# Patient Record
Sex: Female | Born: 1990 | Race: White | Hispanic: No | Marital: Single | Smoking: Former smoker
Health system: Southern US, Community
[De-identification: ages and names within clinical notes are randomized; demographics above are authoritative.]

## PROBLEM LIST (undated history)

## (undated) ENCOUNTER — Inpatient Hospital Stay (HOSPITAL_COMMUNITY): Payer: Self-pay

## (undated) DIAGNOSIS — R569 Unspecified convulsions: Secondary | ICD-10-CM

## (undated) DIAGNOSIS — F419 Anxiety disorder, unspecified: Secondary | ICD-10-CM

## (undated) DIAGNOSIS — M419 Scoliosis, unspecified: Secondary | ICD-10-CM

## (undated) HISTORY — PX: WISDOM TOOTH EXTRACTION: SHX21

## (undated) HISTORY — PX: FINGER SURGERY: SHX640

---
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
## 2011-02-22 LAB — URINALYSIS, ROUTINE W REFLEX MICROSCOPIC
Bilirubin Urine: NEGATIVE
Ketones, ur: NEGATIVE mg/dL
Nitrite: NEGATIVE
Specific Gravity, Urine: 1.015 (ref 1.005–1.030)
Urobilinogen, UA: 0.2 mg/dL (ref 0.0–1.0)
pH: 6.5 (ref 5.0–8.0)

## 2011-02-22 LAB — CBC
HCT: 28.7 % — ABNORMAL LOW (ref 36.0–46.0)
HCT: 37.5 % (ref 36.0–46.0)
Hemoglobin: 12.8 g/dL (ref 12.0–15.0)
MCHC: 34.1 g/dL (ref 30.0–36.0)
MCHC: 34.3 g/dL (ref 30.0–36.0)
Platelets: 196 10*3/uL (ref 150–400)
RDW: 12 % (ref 11.5–15.5)
RDW: 12.5 % (ref 11.5–15.5)
RDW: 12.5 % (ref 11.5–15.5)
WBC: 15.6 10*3/uL — ABNORMAL HIGH (ref 4.0–10.5)
WBC: 8.7 10*3/uL (ref 4.0–10.5)

## 2011-02-22 LAB — URINE MICROSCOPIC-ADD ON

## 2011-02-22 LAB — URINE CULTURE

## 2011-02-22 LAB — DIFFERENTIAL
Eosinophils Relative: 2 % (ref 0–5)
Lymphs Abs: 1.8 10*3/uL (ref 0.7–4.0)
Neutro Abs: 6.3 10*3/uL (ref 1.7–7.7)
Neutrophils Relative %: 72 % (ref 43–77)

## 2011-02-22 LAB — RPR: RPR Ser Ql: NONREACTIVE

## 2011-02-26 LAB — URINE CULTURE: Colony Count: 8000

## 2011-02-26 LAB — URINALYSIS, ROUTINE W REFLEX MICROSCOPIC
Glucose, UA: NEGATIVE mg/dL
Hgb urine dipstick: NEGATIVE
Ketones, ur: NEGATIVE mg/dL
Nitrite: NEGATIVE
Protein, ur: NEGATIVE mg/dL
Urobilinogen, UA: 0.2 mg/dL (ref 0.0–1.0)
pH: 7.5 (ref 5.0–8.0)

## 2011-02-26 LAB — URINE MICROSCOPIC-ADD ON

## 2011-03-10 ENCOUNTER — Emergency Department (HOSPITAL_COMMUNITY)
Admission: EM | Admit: 2011-03-10 | Discharge: 2011-03-10 | Disposition: A | Discharge status: Home / Self Care | Payer: Medicaid Other | Attending: Emergency Medicine | Admitting: Emergency Medicine

## 2011-03-10 DIAGNOSIS — E739 Lactose intolerance, unspecified: Secondary | ICD-10-CM | POA: Insufficient documentation

## 2011-03-10 DIAGNOSIS — R109 Unspecified abdominal pain: Secondary | ICD-10-CM | POA: Insufficient documentation

## 2011-03-10 DIAGNOSIS — J029 Acute pharyngitis, unspecified: Secondary | ICD-10-CM | POA: Insufficient documentation

## 2011-03-10 LAB — POCT PREGNANCY, URINE: Preg Test, Ur: NEGATIVE

## 2011-03-10 LAB — URINALYSIS, ROUTINE W REFLEX MICROSCOPIC
Protein, ur: NEGATIVE mg/dL
Specific Gravity, Urine: 1.019 (ref 1.005–1.030)

## 2011-03-11 LAB — URINALYSIS, ROUTINE W REFLEX MICROSCOPIC
Glucose, UA: NEGATIVE mg/dL
Ketones, ur: >80 mg/dL — AB
Protein, ur: NEGATIVE mg/dL

## 2011-03-12 LAB — COMPREHENSIVE METABOLIC PANEL
ALT: 20 U/L (ref 0–35)
Calcium: 8.8 mg/dL (ref 8.4–10.5)
Creatinine, Ser: 0.78 mg/dL (ref 0.4–1.2)
Glucose, Bld: 92 mg/dL (ref 70–99)
Sodium: 138 mEq/L (ref 135–145)
Total Protein: 7.2 g/dL (ref 6.0–8.3)

## 2011-03-12 LAB — URINALYSIS, ROUTINE W REFLEX MICROSCOPIC
Glucose, UA: NEGATIVE mg/dL
Ketones, ur: NEGATIVE mg/dL
Nitrite: NEGATIVE
Urobilinogen, UA: 1 mg/dL (ref 0.0–1.0)

## 2011-03-12 LAB — CBC
Hemoglobin: 14.3 g/dL (ref 12.0–16.0)
MCHC: 33.5 g/dL (ref 31.0–37.0)
MCV: 88.1 fL (ref 78.0–98.0)
RDW: 13 % (ref 11.4–15.5)

## 2011-03-12 LAB — DIFFERENTIAL
Lymphocytes Relative: 19 % — ABNORMAL LOW (ref 24–48)
Lymphs Abs: 2 10*3/uL (ref 1.1–4.8)
Monocytes Relative: 6 % (ref 3–11)
Neutro Abs: 7.9 10*3/uL (ref 1.7–8.0)
Neutrophils Relative %: 73 % — ABNORMAL HIGH (ref 43–71)

## 2011-03-12 LAB — GC/CHLAMYDIA PROBE AMP, GENITAL
Chlamydia, DNA Probe: NEGATIVE
GC Probe Amp, Genital: NEGATIVE

## 2011-03-12 LAB — WET PREP, GENITAL: Yeast Wet Prep HPF POC: NONE SEEN

## 2011-03-12 LAB — LIPASE, BLOOD: Lipase: 28 U/L (ref 11–59)

## 2011-03-14 LAB — GC/CHLAMYDIA PROBE AMP, GENITAL
Chlamydia, DNA Probe: NEGATIVE
GC Probe Amp, Genital: NEGATIVE

## 2011-03-14 LAB — URINALYSIS, ROUTINE W REFLEX MICROSCOPIC
Nitrite: NEGATIVE
Specific Gravity, Urine: <1.005 — ABNORMAL LOW (ref 1.005–1.030)
Urobilinogen, UA: 0.2 mg/dL (ref 0.0–1.0)
pH: 6.5 (ref 5.0–8.0)

## 2011-03-14 LAB — CBC
MCHC: 33.9 g/dL (ref 31.0–37.0)
MCV: 87.5 fL (ref 78.0–98.0)
RBC: 4.62 MIL/uL (ref 3.80–5.70)
RDW: 12.9 % (ref 11.4–15.5)

## 2011-03-14 LAB — URINE MICROSCOPIC-ADD ON

## 2011-03-14 LAB — WET PREP, GENITAL: Clue Cells Wet Prep HPF POC: NONE SEEN

## 2011-03-14 LAB — POCT PREGNANCY, URINE: Preg Test, Ur: NEGATIVE

## 2011-03-20 LAB — WET PREP, GENITAL
Clue Cells Wet Prep HPF POC: NONE SEEN
Clue Cells Wet Prep HPF POC: NONE SEEN
Trich, Wet Prep: NONE SEEN
Yeast Wet Prep HPF POC: NONE SEEN

## 2011-03-20 LAB — HCG, SERUM, QUALITATIVE: Preg, Serum: NEGATIVE

## 2011-03-20 LAB — URINALYSIS, ROUTINE W REFLEX MICROSCOPIC
Bilirubin Urine: NEGATIVE
Hgb urine dipstick: NEGATIVE
Ketones, ur: 15 mg/dL — AB
Specific Gravity, Urine: 1.02 (ref 1.005–1.030)
pH: 6 (ref 5.0–8.0)

## 2011-03-20 LAB — GC/CHLAMYDIA PROBE AMP, GENITAL
Chlamydia, DNA Probe: NEGATIVE
Chlamydia, DNA Probe: NEGATIVE

## 2011-03-21 LAB — PREGNANCY, URINE: Preg Test, Ur: NEGATIVE

## 2011-03-21 LAB — POCT PREGNANCY, URINE: Preg Test, Ur: NEGATIVE

## 2011-03-21 LAB — CBC
HCT: 41.5 % (ref 36.0–49.0)
Hemoglobin: 14.3 g/dL (ref 12.0–16.0)
MCV: 85 fL (ref 78.0–98.0)
RDW: 12.5 % (ref 11.4–15.5)

## 2011-03-21 LAB — RAPID URINE DRUG SCREEN, HOSP PERFORMED
Barbiturates: NOT DETECTED
Cocaine: NOT DETECTED
Opiates: NOT DETECTED

## 2011-03-21 LAB — DIFFERENTIAL
Basophils Absolute: 0 10*3/uL (ref 0.0–0.1)
Basophils Relative: 0 % (ref 0–1)
Monocytes Relative: 9 % (ref 3–11)
Neutro Abs: 5.9 10*3/uL (ref 1.7–8.0)
Neutrophils Relative %: 71 % (ref 43–71)

## 2011-03-21 LAB — MONONUCLEOSIS SCREEN: Mono Screen: NEGATIVE

## 2011-03-21 LAB — COMPREHENSIVE METABOLIC PANEL
Alkaline Phosphatase: 93 U/L (ref 47–119)
BUN: 8 mg/dL (ref 6–23)
Creatinine, Ser: 0.92 mg/dL (ref 0.4–1.2)
Glucose, Bld: 92 mg/dL (ref 70–99)
Potassium: 3.2 mEq/L — ABNORMAL LOW (ref 3.5–5.1)
Total Bilirubin: 0.5 mg/dL (ref 0.3–1.2)
Total Protein: 7.5 g/dL (ref 6.0–8.3)

## 2011-03-21 LAB — RAPID STREP SCREEN (MED CTR MEBANE ONLY): Streptococcus, Group A Screen (Direct): NEGATIVE

## 2011-03-21 LAB — URINALYSIS, ROUTINE W REFLEX MICROSCOPIC
Bilirubin Urine: NEGATIVE
Hgb urine dipstick: NEGATIVE
Ketones, ur: NEGATIVE mg/dL
Nitrite: NEGATIVE
Protein, ur: NEGATIVE mg/dL
Urobilinogen, UA: 0.2 mg/dL (ref 0.0–1.0)

## 2011-03-21 LAB — WET PREP, GENITAL: Clue Cells Wet Prep HPF POC: NONE SEEN

## 2011-03-21 LAB — TSH: TSH: 2.153 u[IU]/mL (ref 0.350–4.500)

## 2011-03-21 LAB — IRON AND TIBC
TIBC: 385 ug/dL (ref 250–470)
UIBC: 373 ug/dL

## 2011-03-21 LAB — HCG, QUANTITATIVE, PREGNANCY: hCG, Beta Chain, Quant, S: <2 m[IU]/mL (ref <5)

## 2011-03-21 LAB — URINE CULTURE

## 2011-03-21 LAB — GC/CHLAMYDIA PROBE AMP, GENITAL: Chlamydia, DNA Probe: NEGATIVE

## 2011-03-21 LAB — T3, FREE: T3, Free: 2.9 pg/mL (ref 2.3–4.2)

## 2011-12-08 ENCOUNTER — Encounter (HOSPITAL_COMMUNITY): Payer: Self-pay | Admitting: *Deleted

## 2011-12-08 ENCOUNTER — Inpatient Hospital Stay (HOSPITAL_COMMUNITY)
Admission: AD | Admit: 2011-12-08 | Discharge: 2011-12-08 | Disposition: A | Discharge status: Home / Self Care | Payer: Self-pay | Source: Ambulatory Visit | Attending: Obstetrics & Gynecology | Admitting: Obstetrics & Gynecology

## 2011-12-08 DIAGNOSIS — N938 Other specified abnormal uterine and vaginal bleeding: Secondary | ICD-10-CM | POA: Insufficient documentation

## 2011-12-08 DIAGNOSIS — N949 Unspecified condition associated with female genital organs and menstrual cycle: Secondary | ICD-10-CM | POA: Insufficient documentation

## 2011-12-08 HISTORY — DX: Anxiety disorder, unspecified: F41.9

## 2011-12-08 HISTORY — DX: Scoliosis, unspecified: M41.9

## 2011-12-08 LAB — WET PREP, GENITAL
Clue Cells Wet Prep HPF POC: NONE SEEN
Trich, Wet Prep: NONE SEEN
Yeast Wet Prep HPF POC: NONE SEEN

## 2011-12-08 LAB — CBC
HCT: 36 % (ref 36.0–46.0)
MCHC: 33.9 g/dL (ref 30.0–36.0)
Platelets: 179 10*3/uL (ref 150–400)
RDW: 12.3 % (ref 11.5–15.5)
WBC: 7.4 10*3/uL (ref 4.0–10.5)

## 2011-12-08 MED ORDER — ESTROGENS CONJUGATED 1.25 MG PO TABS: 1.2500 mg | ORAL_TABLET | Freq: Every day | ORAL | Status: DC

## 2011-12-08 NOTE — Progress Notes (Signed)
Pt states she has had constant vaginal bleeding for the last 2 months

## 2011-12-08 NOTE — ED Provider Notes (Signed)
History     Chief Complaint  Patient presents with  . Vaginal Bleeding   HPI  Pt states she has had constant vaginal bleeding for the last 2 months.  Also reports lower pelvic cramping that started one week after bleeding began.   Bleeding is described as more than a period.  +clots, pea sized.  Implanon placed Oct 2011.  Denies abnormal vaginal discharge or pain.     Past Medical History  Diagnosis Date  . Migraine   . Anxiety   . Scoliosis     Past Surgical History  Procedure Date  . No past surgeries   . Finger surgery     20 yo    Family History  Problem Relation Age of Onset  . Hypertension Maternal Grandmother   . Drug abuse Maternal Grandfather   . Hypertension Paternal Grandmother   . Drug abuse Paternal Grandfather     History  Substance Use Topics  . Smoking status: Current Everyday Smoker -- 0.5 packs/day  . Smokeless tobacco: Not on file  . Alcohol Use: No    Allergies:  Allergies  Allergen Reactions  . Ciprofloxacin Hcl Hives    Prescriptions prior to admission  Medication Sig Dispense Refill  . Ibuprofen-Diphenhydramine HCl (ADVIL PM) 200-25 MG CAPS Take 2 capsules by mouth at bedtime as needed. sleep         Review of Systems  Gastrointestinal: Positive for abdominal pain (sharp pain).  Genitourinary:       Vaginal bleeding   Physical Exam   Blood pressure 114/66, pulse 80, temperature 98.9 F (37.2 C), temperature source Oral, resp. rate 20, height 5\' 5"  (1.651 m), weight 71.215 kg (157 lb), last menstrual period 12/08/2011, SpO2 96.00%.  Physical Exam  Constitutional: She is oriented to person, place, and time. She appears well-developed and well-nourished.  HENT:  Head: Normocephalic.  Neck: Normal range of motion. Neck supple.  Cardiovascular: Normal rate, regular rhythm and normal heart sounds.   Respiratory: Effort normal and breath sounds normal.  GI: Soft. She exhibits no mass. There is tenderness. There is no guarding.    Genitourinary: Uterus is not enlarged. Cervix exhibits no motion tenderness. Right adnexum displays no mass and no tenderness. Left adnexum displays no mass and no tenderness. There is bleeding (negative clots) around the vagina.  Neurological: She is alert and oriented to person, place, and time. She has normal reflexes.  Skin: Skin is warm and dry.    MAU Course  Procedures  Results for orders placed during the hospital encounter of 12/08/11 (from the past 24 hour(s))  WET PREP, GENITAL     Status: Normal   Collection Time   12/08/11  9:41 PM      Component Value Range   Yeast, Wet Prep NONE SEEN  NONE SEEN    Trich, Wet Prep NONE SEEN  NONE SEEN    Clue Cells, Wet Prep NONE SEEN  NONE SEEN    WBC, Wet Prep HPF POC NONE SEEN  NONE SEEN   CBC     Status: Normal   Collection Time   12/08/11  9:42 PM      Component Value Range   WBC 7.4  4.0 - 10.5 (K/uL)   RBC 4.16  3.87 - 5.11 (MIL/uL)   Hemoglobin 12.2  12.0 - 15.0 (g/dL)   HCT 16.1  09.6 - 04.5 (%)   MCV 86.5  78.0 - 100.0 (fL)   MCH 29.3  26.0 - 34.0 (pg)  MCHC 33.9  30.0 - 36.0 (g/dL)   RDW 16.1  09.6 - 04.5 (%)   Platelets 179  150 - 400 (K/uL)  POCT PREGNANCY, URINE     Status: Normal   Collection Time   12/08/11  9:53 PM      Component Value Range   Preg Test, Ur NEGATIVE       Assessment and Plan  Dysfunctional Uterine Bleeding on Contraception  Plan: Conjugated estrogen 1.25 mg q day x 7 days FU with provider if bleeding continues  [facility] 12/08/2011, 9:37 PM

## 2011-12-09 LAB — GC/CHLAMYDIA PROBE AMP, GENITAL: Chlamydia, DNA Probe: NEGATIVE

## 2012-03-31 ENCOUNTER — Emergency Department (HOSPITAL_BASED_OUTPATIENT_CLINIC_OR_DEPARTMENT_OTHER)
Admission: EM | Admit: 2012-03-31 | Discharge: 2012-03-31 | Disposition: A | Discharge status: Home Health | Payer: Self-pay | Attending: Emergency Medicine | Admitting: Emergency Medicine

## 2012-03-31 ENCOUNTER — Encounter (HOSPITAL_BASED_OUTPATIENT_CLINIC_OR_DEPARTMENT_OTHER): Payer: Self-pay | Admitting: *Deleted

## 2012-03-31 DIAGNOSIS — F3289 Other specified depressive episodes: Secondary | ICD-10-CM | POA: Insufficient documentation

## 2012-03-31 DIAGNOSIS — J069 Acute upper respiratory infection, unspecified: Secondary | ICD-10-CM | POA: Insufficient documentation

## 2012-03-31 DIAGNOSIS — M4120 Other idiopathic scoliosis, site unspecified: Secondary | ICD-10-CM | POA: Insufficient documentation

## 2012-03-31 DIAGNOSIS — F329 Major depressive disorder, single episode, unspecified: Secondary | ICD-10-CM | POA: Insufficient documentation

## 2012-03-31 DIAGNOSIS — F411 Generalized anxiety disorder: Secondary | ICD-10-CM | POA: Insufficient documentation

## 2012-03-31 DIAGNOSIS — F17200 Nicotine dependence, unspecified, uncomplicated: Secondary | ICD-10-CM | POA: Insufficient documentation

## 2012-03-31 NOTE — Discharge Instructions (Signed)
Antibiotic Nonuse  Your caregiver felt that the infection or problem was not one that would be helped with an antibiotic. Infections may be caused by viruses or bacteria. Only a caregiver can tell which one of these is the likely cause of an illness. A cold is the most common cause of infection in both adults and children. A cold is a virus. Antibiotic treatment will have no effect on a viral infection. Viruses can lead to many lost days of work caring for sick children and many missed days of school. Children may catch as many as 10 "colds" or "flus" per year during which they can be tearful, cranky, and uncomfortable. The goal of treating a virus is aimed at keeping the ill person comfortable. Antibiotics are medications used to help the body fight bacterial infections. There are relatively few types of bacteria that cause infections but there are hundreds of viruses. While both viruses and bacteria cause infection they are very different types of germs. A viral infection will typically go away by itself within 7 to 10 days. Bacterial infections may spread or get worse without antibiotic treatment. Examples of bacterial infections are:  Sore throats (like strep throat or tonsillitis).   Infection in the lung (pneumonia).   Ear and skin infections.  Examples of viral infections are:  Colds or flus.   Most coughs and bronchitis.   Sore throats not caused by Strep.   Runny noses.  It is often best not to take an antibiotic when a viral infection is the cause of the problem. Antibiotics can kill off the helpful bacteria that we have inside our body and allow harmful bacteria to start growing. Antibiotics can cause side effects such as allergies, nausea, and diarrhea without helping to improve the symptoms of the viral infection. Additionally, repeated uses of antibiotics can cause bacteria inside of our body to become resistant. That resistance can be passed onto harmful bacterial. The next time  you have an infection it may be harder to treat if antibiotics are used when they are not needed. Not treating with antibiotics allows our own immune system to develop and take care of infections more efficiently. Also, antibiotics will work better for us when they are prescribed for bacterial infections. Treatments for a child that is ill may include:  Give extra fluids throughout the day to stay hydrated.   Get plenty of rest.   Only give your child over-the-counter or prescription medicines for pain, discomfort, or fever as directed by your caregiver.   The use of a cool mist humidifier may help stuffy noses.   Cold medications if suggested by your caregiver.  Your caregiver may decide to start you on an antibiotic if:  The problem you were seen for today continues for a longer length of time than expected.   You develop a secondary bacterial infection.  SEEK MEDICAL CARE IF:  Fever lasts longer than 5 days.   Symptoms continue to get worse after 5 to 7 days or become severe.   Difficulty in breathing develops.   Signs of dehydration develop (poor drinking, rare urinating, dark colored urine).   Changes in behavior or worsening tiredness (listlessness or lethargy).  Document Released: 02/03/2002 Document Revised: 11/14/2011 Document Reviewed: 08/02/2009 ExitCare Patient Information 2012 [employer].Upper Respiratory Infection, Adult An upper respiratory infection (URI) is also sometimes known as the common cold. The upper respiratory tract includes the nose, sinuses, throat, trachea, and bronchi. Bronchi are the airways leading to the lungs. Most   people improve within 1 week, but symptoms can last up to 2 weeks. A residual cough may last even longer.  CAUSES Many different viruses can infect the tissues lining the upper respiratory tract. The tissues become irritated and inflamed and often become very moist. Mucus production is also common. A cold is contagious. You can easily  spread the virus to others by oral contact. This includes kissing, sharing a glass, coughing, or sneezing. Touching your mouth or nose and then touching a surface, which is then touched by another person, can also spread the virus. SYMPTOMS  Symptoms typically develop 1 to 3 days after you come in contact with a cold virus. Symptoms vary from person to person. They may include:  Runny nose.   Sneezing.   Nasal congestion.   Sinus irritation.   Sore throat.   Loss of voice (laryngitis).   Cough.   Fatigue.   Muscle aches.   Loss of appetite.   Headache.   Low-grade fever.  DIAGNOSIS  You might diagnose your own cold based on familiar symptoms, since most people get a cold 2 to 3 times a year. Your caregiver can confirm this based on your exam. Most importantly, your caregiver can check that your symptoms are not due to another disease such as strep throat, sinusitis, pneumonia, asthma, or epiglottitis. Blood tests, throat tests, and X-rays are not necessary to diagnose a common cold, but they may sometimes be helpful in excluding other more serious diseases. Your caregiver will decide if any further tests are required. RISKS AND COMPLICATIONS  You may be at risk for a more severe case of the common cold if you smoke cigarettes, have chronic heart disease (such as heart failure) or lung disease (such as asthma), or if you have a weakened immune system. The very young and very old are also at risk for more serious infections. Bacterial sinusitis, middle ear infections, and bacterial pneumonia can complicate the common cold. The common cold can worsen asthma and chronic obstructive pulmonary disease (COPD). Sometimes, these complications can require emergency medical care and may be life-threatening. PREVENTION  The best way to protect against getting a cold is to practice good hygiene. Avoid oral or hand contact with people with cold symptoms. Wash your hands often if contact occurs.  There is no clear evidence that vitamin C, vitamin E, echinacea, or exercise reduces the chance of developing a cold. However, it is always recommended to get plenty of rest and practice good nutrition. TREATMENT  Treatment is directed at relieving symptoms. There is no cure. Antibiotics are not effective, because the infection is caused by a virus, not by bacteria. Treatment may include:  Increased fluid intake. Sports drinks offer valuable electrolytes, sugars, and fluids.   Breathing heated mist or steam (vaporizer or shower).   Eating chicken soup or other clear broths, and maintaining good nutrition.   Getting plenty of rest.   Using gargles or lozenges for comfort.   Controlling fevers with ibuprofen or acetaminophen as directed by your caregiver.   Increasing usage of your inhaler if you have asthma.  Zinc gel and zinc lozenges, taken in the first 24 hours of the common cold, can shorten the duration and lessen the severity of symptoms. Pain medicines may help with fever, muscle aches, and throat pain. A variety of non-prescription medicines are available to treat congestion and runny nose. Your caregiver can make recommendations and may suggest nasal or lung inhalers for other symptoms.  HOME CARE INSTRUCTIONS     Only take over-the-counter or prescription medicines for pain, discomfort, or fever as directed by your caregiver.   Use a warm mist humidifier or inhale steam from a shower to increase air moisture. This may keep secretions moist and make it easier to breathe.   Drink enough water and fluids to keep your urine clear or pale yellow.   Rest as needed.   Return to work when your temperature has returned to normal or as your caregiver advises. You may need to stay home longer to avoid infecting others. You can also use a face mask and careful hand washing to prevent spread of the virus.  SEEK MEDICAL CARE IF:   After the first few days, you feel you are getting worse  rather than better.   You need your caregiver's advice about medicines to control symptoms.   You develop chills, worsening shortness of breath, or brown or red sputum. These may be signs of pneumonia.   You develop yellow or brown nasal discharge or pain in the face, especially when you bend forward. These may be signs of sinusitis.   You develop a fever, swollen neck glands, pain with swallowing, or white areas in the back of your throat. These may be signs of strep throat.  SEEK IMMEDIATE MEDICAL CARE IF:   You have a fever.   You develop severe or persistent headache, ear pain, sinus pain, or chest pain.   You develop wheezing, a prolonged cough, cough up blood, or have a change in your usual mucus (if you have chronic lung disease).   You develop sore muscles or a stiff neck.  Document Released: 05/21/2001 Document Revised: 11/14/2011 Document Reviewed: 03/29/2011 ExitCare Patient Information 2012 [employer]. 

## 2012-03-31 NOTE — ED Notes (Signed)
Pt c/o URI symptoms x 2 days 

## 2012-03-31 NOTE — ED Provider Notes (Signed)
History     CSN: [id number]  Arrival date & time 03/31/12  1557   First MD Initiated Contact with Patient 03/31/12 1603      Chief Complaint  Patient presents with  . URI    (Consider location/radiation/quality/duration/timing/severity/associated sxs/prior treatment) HPI Comments: Pt states that she is under a lot of stress with work and she is very stressed and she cries a lot Pt denies si/hi  Patient is a 21 y.o. female presenting with URI. The history is provided by the patient. No language interpreter was used.  URI The primary symptoms include ear pain, sore throat and cough. Primary symptoms do not include fever. The current episode started 2 days ago. This is a new problem. The problem has not changed since onset. Symptoms associated with the illness include congestion.    Past Medical History  Diagnosis Date  . Migraine   . Anxiety   . Scoliosis     Past Surgical History  Procedure Date  . No past surgeries   . Finger surgery     21 yo    Family History  Problem Relation Age of Onset  . Hypertension Maternal Grandmother   . Drug abuse Maternal Grandfather   . Hypertension Paternal Grandmother   . Drug abuse Paternal Grandfather     History  Substance Use Topics  . Smoking status: Current Everyday Smoker -- 0.5 packs/day  . Smokeless tobacco: Not on file  . Alcohol Use: No    OB History    Grav Para Term Preterm Abortions TAB SAB Ect Mult Living   1 1        1       Review of Systems  Constitutional: Negative.  Negative for fever.  HENT: Positive for ear pain, congestion and sore throat.   Eyes: Negative.   Respiratory: Positive for cough.   Genitourinary: Negative.     Allergies  Ciprofloxacin hcl  Home Medications   Current Outpatient Rx  Name Route Sig Dispense Refill  . ETONOGESTREL 68 MG [place] IMPL Subcutaneous Inject 1 each into the skin once. Implanted October 2011      Pulse 96  Temp(Src) 98.8 F (37.1 C) (Oral)  Resp 16  Ht  5\' 5"  (1.651 m)  Wt 170 lb (77.111 kg)  BMI 28.29 kg/m2  SpO2 100%  LMP 03/28/2012  Physical Exam  Nursing note and vitals reviewed. Constitutional: She appears well-developed and well-nourished.  HENT:  Right Ear: External ear normal.  Left Ear: External ear normal.  Nose: Rhinorrhea present.  Eyes: Conjunctivae and EOM are normal.  Neck: Neck supple.  Cardiovascular: Normal rate and regular rhythm.   Pulmonary/Chest: Breath sounds normal.  Musculoskeletal: Normal range of motion.  Skin: Skin is warm and dry.  Psychiatric:       Tearful:denies si/hi    ED Course  Procedures (including critical care time)  Labs Reviewed - No data to display No results found.   1. URI (upper respiratory infection)   2. Anxiety and depression       MDM  Pt not hi/si:discussed options for the pt with anxiety related symptoms:no antibiotics are needed at this time        Teressa Lower, NP 03/31/12 1634

## 2012-04-01 NOTE — ED Provider Notes (Signed)
Medical screening examination/treatment/procedure(s) were performed by non-physician practitioner and as supervising physician I was immediately available for consultation/collaboration.  Margarit Minshall, MD 04/01/12 2141 

## 2012-10-17 ENCOUNTER — Encounter (HOSPITAL_COMMUNITY): Payer: Self-pay | Admitting: Emergency Medicine

## 2012-10-17 ENCOUNTER — Emergency Department (INDEPENDENT_AMBULATORY_CARE_PROVIDER_SITE_OTHER)
Admission: EM | Admit: 2012-10-17 | Discharge: 2012-10-17 | Disposition: A | Discharge status: Home / Self Care | Payer: Medicaid Other | Source: Home / Self Care

## 2012-10-17 DIAGNOSIS — J029 Acute pharyngitis, unspecified: Secondary | ICD-10-CM

## 2012-10-17 MED ORDER — AMOXICILLIN 500 MG PO CAPS: 500.0000 mg | ORAL_CAPSULE | Freq: Three times a day (TID) | ORAL | Status: DC

## 2012-10-17 NOTE — ED Notes (Signed)
Pt c/o sore throat with white patches since Friday.   Body aches and soreness in throat  Low grade temp  Pt denies n/v/d

## 2012-10-17 NOTE — Discharge Instructions (Signed)
Cepacol lozenges and Chloraseptic throat spray for pain relief. Ibuprofen 600 mg every 6 hours when necessary throat pain, fever and bodyaches. Plenty of fluids and stay well hydrated

## 2012-10-17 NOTE — ED Provider Notes (Signed)
History     CSN: [id number]  Arrival date & time 10/17/12  1404   None     Chief Complaint  Patient presents with  . Sore Throat    sore throat started friday, body ache neck pain white patches in throat    (Consider location/radiation/quality/duration/timing/severity/associated sxs/prior treatment) HPI Comments: 21 year old female with sore throat for 3 days. Associated with bilateral ear discomfort, PND, nasal stuffiness and feeling achy L. over. She denies stomach pain or vomiting.   Past Medical History  Diagnosis Date  . Migraine   . Anxiety   . Scoliosis     Past Surgical History  Procedure Date  . No past surgeries   . Finger surgery     21 yo    Family History  Problem Relation Age of Onset  . Hypertension Maternal Grandmother   . Drug abuse Maternal Grandfather   . Hypertension Paternal Grandmother   . Drug abuse Paternal Grandfather     History  Substance Use Topics  . Smoking status: Current Every Day Smoker -- 0.5 packs/day  . Smokeless tobacco: Not on file  . Alcohol Use: No    OB History    Grav Para Term Preterm Abortions TAB SAB Ect Mult Living   1 1        1       Review of Systems  Constitutional: Positive for fever and fatigue. Negative for chills, activity change and appetite change.  HENT: Positive for congestion, sore throat, rhinorrhea and postnasal drip. Negative for facial swelling, neck pain and neck stiffness.   Eyes: Negative.   Respiratory: Negative.   Cardiovascular: Negative.   Gastrointestinal: Negative.   Genitourinary: Negative.   Skin: Negative for pallor and rash.  Neurological: Negative.     Allergies  Ciprofloxacin hcl  Home Medications   Current Outpatient Rx  Name  Route  Sig  Dispense  Refill  . AMOXICILLIN 500 MG PO CAPS   Oral   Take 1 capsule (500 mg total) by mouth 3 (three) times daily.   20 capsule   0   . ETONOGESTREL 68 MG [place] IMPL   Subcutaneous   Inject 1 each into the skin once.  Implanted October 2011           BP 123/78  Pulse 101  Temp 99.3 F (37.4 C) (Oral)  Resp 22  SpO2 100%  Physical Exam  Nursing note and vitals reviewed. Constitutional: She is oriented to person, place, and time. She appears well-developed and well-nourished. No distress.  HENT:       Although there is partial cerumen obstruction both TMs are pearly gray and translucent no erythema or effusions. Oropharynx with mildly edematous tonsils deep erythema and exudates.  Eyes: Conjunctivae normal and EOM are normal.  Neck: Normal range of motion. Neck supple.  Cardiovascular: Normal rate and regular rhythm.   Pulmonary/Chest: Effort normal and breath sounds normal. No respiratory distress. She has no wheezes.  Musculoskeletal: Normal range of motion. She exhibits no edema.  Lymphadenopathy:    She has no cervical adenopathy.  Neurological: She is alert and oriented to person, place, and time.  Skin: Skin is warm and dry. No rash noted.  Psychiatric: She has a normal mood and affect.    ED Course  Procedures (including critical care time)  Labs Reviewed  POCT RAPID STREP A ([facility]RG CARE ONLY) - Abnormal; Notable for the following:    Streptococcus, Group A Screen (Direct) POSITIVE (*)  All other components within normal limits   No results found.   1. Exudative pharyngitis       MDM  Ibuprofen 600 mg every 6 hours when necessary sore throat Cepacol lozenges frequently or Chloraseptic Spray when necessary sore Treatment plan fluids and stay well-hydrated        Hayden Rasmussen, NP 10/17/12 1528

## 2012-10-18 NOTE — ED Provider Notes (Signed)
Medical screening examination/treatment/procedure(s) were performed by resident physician or non-physician practitioner and as supervising physician I was immediately available for consultation/collaboration.   Barkley Bruns MD.    Linna Hoff, MD 10/18/12 [phone]

## 2014-10-10 ENCOUNTER — Encounter (HOSPITAL_COMMUNITY): Payer: Self-pay | Admitting: Emergency Medicine

## 2014-11-08 ENCOUNTER — Encounter (HOSPITAL_COMMUNITY): Payer: Self-pay | Admitting: *Deleted

## 2014-11-08 ENCOUNTER — Inpatient Hospital Stay (HOSPITAL_COMMUNITY)
Admission: AD | Admit: 2014-11-08 | Discharge: 2014-11-08 | Disposition: A | Discharge status: Home / Self Care | Payer: Medicaid Other | Source: Ambulatory Visit | Attending: Obstetrics and Gynecology | Admitting: Obstetrics and Gynecology

## 2014-11-08 ENCOUNTER — Inpatient Hospital Stay (HOSPITAL_COMMUNITY): Payer: Medicaid Other

## 2014-11-08 DIAGNOSIS — Z3A01 Less than 8 weeks gestation of pregnancy: Secondary | ICD-10-CM | POA: Insufficient documentation

## 2014-11-08 DIAGNOSIS — O99331 Smoking (tobacco) complicating pregnancy, first trimester: Secondary | ICD-10-CM | POA: Insufficient documentation

## 2014-11-08 DIAGNOSIS — O9989 Other specified diseases and conditions complicating pregnancy, childbirth and the puerperium: Secondary | ICD-10-CM

## 2014-11-08 DIAGNOSIS — R109 Unspecified abdominal pain: Secondary | ICD-10-CM

## 2014-11-08 DIAGNOSIS — R1030 Lower abdominal pain, unspecified: Secondary | ICD-10-CM | POA: Insufficient documentation

## 2014-11-08 DIAGNOSIS — Z3490 Encounter for supervision of normal pregnancy, unspecified, unspecified trimester: Secondary | ICD-10-CM

## 2014-11-08 DIAGNOSIS — F17210 Nicotine dependence, cigarettes, uncomplicated: Secondary | ICD-10-CM | POA: Insufficient documentation

## 2014-11-08 DIAGNOSIS — O26899 Other specified pregnancy related conditions, unspecified trimester: Secondary | ICD-10-CM

## 2014-11-08 LAB — URINALYSIS, ROUTINE W REFLEX MICROSCOPIC
BILIRUBIN URINE: NEGATIVE
Glucose, UA: NEGATIVE mg/dL
Hgb urine dipstick: NEGATIVE
KETONES UR: NEGATIVE mg/dL
NITRITE: NEGATIVE
Protein, ur: NEGATIVE mg/dL
Specific Gravity, Urine: 1.02 (ref 1.005–1.030)
UROBILINOGEN UA: 0.2 mg/dL (ref 0.0–1.0)
pH: 7.5 (ref 5.0–8.0)

## 2014-11-08 LAB — CBC
HCT: 36.5 % (ref 36.0–46.0)
Hemoglobin: 12.9 g/dL (ref 12.0–15.0)
MCH: 30.7 pg (ref 26.0–34.0)
MCHC: 35.3 g/dL (ref 30.0–36.0)
MCV: 86.9 fL (ref 78.0–100.0)
PLATELETS: 165 10*3/uL (ref 150–400)
RBC: 4.2 MIL/uL (ref 3.87–5.11)
RDW: 12.1 % (ref 11.5–15.5)
WBC: 7.2 10*3/uL (ref 4.0–10.5)

## 2014-11-08 LAB — URINE MICROSCOPIC-ADD ON

## 2014-11-08 LAB — WET PREP, GENITAL
Clue Cells Wet Prep HPF POC: NONE SEEN
Trich, Wet Prep: NONE SEEN
YEAST WET PREP: NONE SEEN

## 2014-11-08 LAB — HIV ANTIBODY (ROUTINE TESTING W REFLEX): HIV 1&2 Ab, 4th Generation: NONREACTIVE

## 2014-11-08 LAB — POCT PREGNANCY, URINE: Preg Test, Ur: POSITIVE — AB

## 2014-11-08 LAB — HCG, QUANTITATIVE, PREGNANCY: hCG, Beta Chain, Quant, S: 6539 m[IU]/mL — ABNORMAL HIGH (ref <5)

## 2014-11-08 LAB — ABO/RH: ABO/RH(D): AB POS

## 2014-11-08 MED ORDER — CONCEPT OB 130-92.4-1 MG PO CAPS: 1.0000 | ORAL_CAPSULE | Freq: Every day | ORAL | Status: DC

## 2014-11-08 NOTE — Discharge Instructions (Signed)
Your ultrasound today showed that the pregnancy is growing in your uterus, but it is too early to see the baby.  Abdominal Pain During Pregnancy Abdominal pain is common in pregnancy. Most of the time, it does not cause harm. There are many causes of abdominal pain. Some causes are more serious than others. Some of the causes of abdominal pain in pregnancy are easily diagnosed. Occasionally, the diagnosis takes time to understand. Other times, the cause is not determined. Abdominal pain can be a sign that something is very wrong with the pregnancy, or the pain may have nothing to do with the pregnancy at all. For this reason, always tell your health care provider if you have any abdominal discomfort. HOME CARE INSTRUCTIONS  Monitor your abdominal pain for any changes. The following actions may help to alleviate any discomfort you are experiencing:  Do not have sexual intercourse or put anything in your vagina until your symptoms go away completely.  Get plenty of rest until your pain improves.  Drink clear fluids if you feel nauseous. Avoid solid food as long as you are uncomfortable or nauseous.  Only take over-the-counter or prescription medicine as directed by your health care provider.  Keep all follow-up appointments with your health care provider. SEEK IMMEDIATE MEDICAL CARE IF:  You are bleeding, leaking fluid, or passing tissue from the vagina.  You have increasing pain or cramping.  You have persistent vomiting.  You have painful or bloody urination.  You have a fever.  You notice a decrease in your baby's movements.  You have extreme weakness or feel faint.  You have shortness of breath, with or without abdominal pain.  You develop a severe headache with abdominal pain.  You have abnormal vaginal discharge with abdominal pain.  You have persistent diarrhea.  You have abdominal pain that continues even after rest, or gets worse. MAKE SURE YOU:   Understand these  instructions.  Will watch your condition.  Will get help right away if you are not doing well or get worse. Document Released: 11/25/2005 Document Revised: 09/15/2013 Document Reviewed: 06/24/2013 [facility] Patient Information 2015 [place], [place]. This information is not intended to replace advice given to you by your health care provider. Make sure you discuss any questions you have with your health care provider.

## 2014-11-08 NOTE — MAU Note (Signed)
Patient states she has been having abdominal pain off and on for about one week. Has nausea, vomited a couple of times a couple of weeks ago. Denies bleeding, has normal vaginal discharge.

## 2014-11-08 NOTE — MAU Provider Note (Signed)
Chief Complaint: Possible Pregnancy; Abdominal Pain; and Nausea   First Provider Initiated Contact with Patient 11/08/14 1148      SUBJECTIVE HPI: Jasmine Ellis is a 23 y.o. G1P1 who presents with possible pregnancy, intermittent low abdominal pain 1 week, and nausea and vomiting 2 weeks. Has not taken UPT. Would be 5 weeks 1 day by LMP. Patient states she has been having abdominal pain off and on for about one week. Has nausea, vomited a couple of times a couple of weeks ago. Denies bleeding, has normal vaginal discharge  Past Medical History  Diagnosis Date  . Migraine   . Anxiety   . Scoliosis    OB History  Gravida Para Term Preterm AB SAB TAB Ectopic Multiple Living  2 1        1     # Outcome Date GA Lbr Len/2nd Weight Sex Delivery Anes PTL Lv  2 Current           1 Para              Past Surgical History  Procedure Laterality Date  . Finger surgery      23 yo   History   Social History  . Marital Status: Single    Spouse Name: N/A    Number of Children: N/A  . Years of Education: N/A   Occupational History  . Not on file.   Social History Main Topics  . Smoking status: Current Every Day Smoker -- 0.25 packs/day    Types: Cigarettes  . Smokeless tobacco: Never Used  . Alcohol Use: No  . Drug Use: No  . Sexual Activity: Yes    Birth Control/ Protection: Implant   Other Topics Concern  . Not on file   Social History Narrative   No current facility-administered medications on file prior to encounter.   Current Outpatient Prescriptions on File Prior to Encounter  Medication Sig Dispense Refill  . amoxicillin (AMOXIL) 500 MG capsule Take 1 capsule (500 mg total) by mouth 3 (three) times daily. (Patient not taking: Reported on 11/08/2014) 21 capsule 0   Allergies  Allergen Reactions  . Ciprofloxacin Hcl Hives    ROS: Pertinent positive items in HPI. Negative for fever, chills, vaginal bleeding, vaginal discharge, vomiting, diarrhea, sick contacts,  urinary complaints, hematuria, flank pain.  OBJECTIVE Blood pressure 113/71, pulse 90, temperature 98.7 F (37.1 C), temperature source Oral, resp. rate 18, height 5\' 3"  (1.6 m), weight 56.246 kg (124 lb), last menstrual period 10/03/2014, SpO2 100 %. GENERAL: Well-developed, well-nourished female in no acute distress.  HEENT: Normocephalic HEART: normal rate RESP: normal effort ABDOMEN: Soft, non-tender. Positive bowel sounds 4. Negative CVAT. EXTREMITIES: Nontender, no edema NEURO: Alert and oriented SPECULUM EXAM: NEFG, physiologic discharge, no blood noted, cervix clean BIMANUAL: cervix closed; uterus normal size, no adnexal tenderness or masses. No cervical motion tenderness.  LAB RESULTS Results for orders placed or performed during the hospital encounter of 11/08/14 (from the past 24 hour(s))  Pregnancy, urine POC     Status: Abnormal   Collection Time: 11/08/14 11:31 AM  Result Value Ref Range   Preg Test, Ur POSITIVE (A) NEGATIVE  Urinalysis, Routine w reflex microscopic     Status: Abnormal   Collection Time: 11/08/14 11:37 AM  Result Value Ref Range   Color, Urine YELLOW YELLOW   APPearance HAZY (A) CLEAR   Specific Gravity, Urine 1.020 1.005 - 1.030   pH 7.5 5.0 - 8.0   Glucose, UA NEGATIVE NEGATIVE  mg/dL   Hgb urine dipstick NEGATIVE NEGATIVE   Bilirubin Urine NEGATIVE NEGATIVE   Ketones, ur NEGATIVE NEGATIVE mg/dL   Protein, ur NEGATIVE NEGATIVE mg/dL   Urobilinogen, UA 0.2 0.0 - 1.0 mg/dL   Nitrite NEGATIVE NEGATIVE   Leukocytes, UA TRACE (A) NEGATIVE  Urine microscopic-add on     Status: Abnormal   Collection Time: 11/08/14 11:37 AM  Result Value Ref Range   Squamous Epithelial / LPF FEW (A) RARE   WBC, UA 0-2 <3 WBC/hpf   Bacteria, UA FEW (A) RARE  ABO/Rh     Status: None   Collection Time: 11/08/14 12:00 PM  Result Value Ref Range   ABO/RH(D) AB POS   hCG, quantitative, pregnancy     Status: Abnormal   Collection Time: 11/08/14 12:00 PM  Result Value  Ref Range   hCG, Beta Chain, Quant, S 6539 (H) <5 mIU/mL  CBC     Status: None   Collection Time: 11/08/14 12:00 PM  Result Value Ref Range   WBC 7.2 4.0 - 10.5 K/uL   RBC 4.20 3.87 - 5.11 MIL/uL   Hemoglobin 12.9 12.0 - 15.0 g/dL   HCT 84.636.5 96.236.0 - 95.246.0 %   MCV 86.9 78.0 - 100.0 fL   MCH 30.7 26.0 - 34.0 pg   MCHC 35.3 30.0 - 36.0 g/dL   RDW 84.112.1 32.411.5 - 40.115.5 %   Platelets 165 150 - 400 K/uL  HIV antibody     Status: None   Collection Time: 11/08/14 12:00 PM  Result Value Ref Range   HIV 1&2 Ab, 4th Generation NONREACTIVE NONREACTIVE  Wet prep, genital     Status: Abnormal   Collection Time: 11/08/14  1:48 PM  Result Value Ref Range   Yeast Wet Prep HPF POC NONE SEEN NONE SEEN   Trich, Wet Prep NONE SEEN NONE SEEN   Clue Cells Wet Prep HPF POC NONE SEEN NONE SEEN   WBC, Wet Prep HPF POC MANY (A) NONE SEEN    IMAGING [place] Ob Comp Less 14 Wks  11/08/2014   CLINICAL DATA:  Abdominal pain in pregnancy. Gestational age by LMP of 5 weeks 1 day  EXAM: OBSTETRIC <14 WK US AND TRANSVAGINAL OB US  TECHNIQUE: Both transabdominal and transvaginal ultrasound examinations were performed for complete evaluation of the gestation as well as the maternal uterus, adnexal regions, and pelvic cul-de-sac. Transvaginal technique was performed to assess early pregnancy.  COMPARISON:  None.  FINDINGS: Intrauterine gestational sac: Visualized/normal in shape.  Yolk sac:  Visualized  Embryo:  Not visualized  MSD:  8  mm   5 w   3  d  Maternal uterus/adnexae: Both ovaries are normal in appearance. No adnexal mass or free fluid identified.  IMPRESSION: Single intrauterine gestational sac with estimated gestational age of [redacted] weeks 3 days by mean sac diameter. This is concordant with LMP.  No significant maternal uterine or adnexal abnormality identified.   Electronically Signed   By: Myles RosenthalJohn  Stahl M.D.   On: 11/08/2014 13:46   [place] Ob Transvaginal  11/08/2014   CLINICAL DATA:  Abdominal pain in pregnancy. Gestational age  by LMP of 5 weeks 1 day  EXAM: OBSTETRIC <14 WK US AND TRANSVAGINAL OB US  TECHNIQUE: Both transabdominal and transvaginal ultrasound examinations were performed for complete evaluation of the gestation as well as the maternal uterus, adnexal regions, and pelvic cul-de-sac. Transvaginal technique was performed to assess early pregnancy.  COMPARISON:  None.  FINDINGS: Intrauterine gestational sac: Visualized/normal  in shape.  Yolk sac:  Visualized  Embryo:  Not visualized  MSD:  8  mm   5 w   3  d  Maternal uterus/adnexae: Both ovaries are normal in appearance. No adnexal mass or free fluid identified.  IMPRESSION: Single intrauterine gestational sac with estimated gestational age of [redacted] weeks 3 days by mean sac diameter. This is concordant with LMP.  No significant maternal uterine or adnexal abnormality identified.   Electronically Signed   By: Myles RosenthalJohn  Stahl M.D.   On: 11/08/2014 13:46    MAU COURSE  ASSESSMENT 1. Intrauterine pregnancy   2. Abdominal pain affecting pregnancy, antepartum     PLAN Discharge home in stable condition. Comfort measures, Tylenol for pain. GC/Chlamydia cultures pending.     Follow-up Information    Follow up with [facility].   Why:  Start prenatal care   Contact information:   [address] [place] [phone]       [facility] with [facility].   Why:  As needed in emergencies   Contact information:   [address] [id number] mc [place] [id number] [phone]       Medication List    STOP taking these medications        amoxicillin 500 MG capsule  Commonly known as:  AMOXIL      TAKE these medications        CONCEPT OB 130-92.4-1 MG Caps  Take 1 tablet by mouth daily.         [place]ia Zhoey Blackstock, CNM 11/08/2014  1:52 PM

## 2014-11-09 LAB — GC/CHLAMYDIA PROBE AMP
CT PROBE, AMP APTIMA: NEGATIVE
GC PROBE AMP APTIMA: NEGATIVE

## 2014-11-15 ENCOUNTER — Emergency Department (HOSPITAL_BASED_OUTPATIENT_CLINIC_OR_DEPARTMENT_OTHER)
Admission: EM | Admit: 2014-11-15 | Discharge: 2014-11-15 | Disposition: A | Discharge status: Home / Self Care | Payer: No Typology Code available for payment source | Attending: Emergency Medicine | Admitting: Emergency Medicine

## 2014-11-15 ENCOUNTER — Encounter (HOSPITAL_BASED_OUTPATIENT_CLINIC_OR_DEPARTMENT_OTHER): Payer: Self-pay | Admitting: *Deleted

## 2014-11-15 DIAGNOSIS — Y92410 Unspecified street and highway as the place of occurrence of the external cause: Secondary | ICD-10-CM | POA: Diagnosis not present

## 2014-11-15 DIAGNOSIS — Y9389 Activity, other specified: Secondary | ICD-10-CM | POA: Insufficient documentation

## 2014-11-15 DIAGNOSIS — Z8659 Personal history of other mental and behavioral disorders: Secondary | ICD-10-CM | POA: Diagnosis not present

## 2014-11-15 DIAGNOSIS — Z720 Tobacco use: Secondary | ICD-10-CM | POA: Diagnosis not present

## 2014-11-15 DIAGNOSIS — Y998 Other external cause status: Secondary | ICD-10-CM | POA: Diagnosis not present

## 2014-11-15 DIAGNOSIS — Z8679 Personal history of other diseases of the circulatory system: Secondary | ICD-10-CM | POA: Insufficient documentation

## 2014-11-15 DIAGNOSIS — S299XXA Unspecified injury of thorax, initial encounter: Secondary | ICD-10-CM | POA: Insufficient documentation

## 2014-11-15 DIAGNOSIS — Z79899 Other long term (current) drug therapy: Secondary | ICD-10-CM | POA: Diagnosis not present
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
## 2014-11-15 NOTE — ED Notes (Signed)
Pt c/o upper and lower back pain. Sts she is [redacted] weeks pregnant. Denies any nausea and vomiting. Denies abdominal pain.

## 2014-11-24 ENCOUNTER — Encounter: Payer: Self-pay | Admitting: Obstetrics & Gynecology

## 2014-11-24 ENCOUNTER — Other Ambulatory Visit (HOSPITAL_COMMUNITY)
Admission: RE | Admit: 2014-11-24 | Discharge: 2014-11-24 | Disposition: A | Discharge status: Home / Self Care | Payer: Medicaid Other | Source: Ambulatory Visit | Attending: Obstetrics & Gynecology | Admitting: Obstetrics & Gynecology

## 2014-11-24 ENCOUNTER — Ambulatory Visit (INDEPENDENT_AMBULATORY_CARE_PROVIDER_SITE_OTHER): Payer: Medicaid Other | Admitting: Obstetrics & Gynecology

## 2014-11-24 VITALS — BP 111/70 | HR 59 | Wt 127.0 lb

## 2014-11-24 DIAGNOSIS — Z01419 Encounter for gynecological examination (general) (routine) without abnormal findings: Secondary | ICD-10-CM | POA: Diagnosis not present

## 2014-11-24 DIAGNOSIS — O09891 Supervision of other high risk pregnancies, first trimester: Secondary | ICD-10-CM

## 2014-11-24 DIAGNOSIS — Z113 Encounter for screening for infections with a predominantly sexual mode of transmission: Secondary | ICD-10-CM | POA: Insufficient documentation

## 2014-11-24 MED ORDER — DOXYLAMINE-PYRIDOXINE 10-10 MG PO TBEC: 1.0000 | DELAYED_RELEASE_TABLET | Freq: Two times a day (BID) | ORAL | Status: DC

## 2014-11-24 MED ORDER — FLUCONAZOLE 150 MG PO TABS: 150.0000 mg | ORAL_TABLET | Freq: Once | ORAL | Status: DC

## 2014-11-24 NOTE — Progress Notes (Signed)
   Subjective:    Jasmine Ellis is a 23 yo S W G2P1001 at 7 weeks 3 days (22 yo son named Jasmine Ellis) being seen today for her first obstetrical visit.  Her obstetrical history is significant for smoker and h/o seizures. [facility]. Patient does intend to breast feed. She breastfed her son for 7 months. Pregnancy history fully reviewed.  Patient reports nausea.  There were no vitals filed for this visit.  HISTORY: OB History  Gravida Para Term Preterm AB SAB TAB Ectopic Multiple Living  2 1 1       2     # Outcome Date GA Lbr Len/2nd Weight Sex Delivery Anes PTL Lv  2 Current           1 Term     M Vag-Spont EPI N Y     Past Medical History  Diagnosis Date  . Migraine   . Anxiety   . Scoliosis    Past Surgical History  Procedure Laterality Date  . Finger surgery      23 yo   Family History  Problem Relation Age of Onset  . Hypertension Maternal Grandmother   . Drug abuse Maternal Grandfather   . Hypertension Paternal Grandmother   . Drug abuse Paternal Grandfather      Exam    Uterus:     Pelvic Exam:    Perineum: No Hemorrhoids   Vulva: normal   Vagina:  normal mucosa   pH:    Cervix: anteverted   Adnexa: normal adnexa   Bony Pelvis: android  System: Breast:  normal appearance, no masses or tenderness   Skin: normal coloration and turgor, no rashes    Neurologic: oriented   Extremities: normal strength, tone, and muscle mass   HEENT PERRLA   Mouth/Teeth mucous membranes moist, pharynx normal without lesions   Neck supple   Cardiovascular: regular rate and rhythm   Respiratory:  appears well, vitals normal, no respiratory distress, acyanotic, normal RR, ear and throat exam is normal, neck free of mass or lymphadenopathy, chest clear, no wheezing, crepitations, rhonchi, normal symmetric air entry   Abdomen: soft, non-tender; bowel sounds normal; no masses,  no organomegaly   Urinary: urethral meatus normal      Assessment:     Pregnancy: G2P1002 There are no active problems to display for this patient.       Plan:     Initial labs drawn. Prenatal vitamins. Problem list reviewed and updated. Genetic Screening discussed Quad Screen: requested.  Ultrasound discussed; fetal survey: requested.  Follow up in 4 weeks. Diclegis prescribed She declines a flu vaccine Rec stop smoking. She has decreased from a ppd to 3 cigs per day.   Jasmine Ellis C. 11/24/2014

## 2014-11-24 NOTE — Progress Notes (Signed)
Ultrasound today measures 4074w5d with positive fetal heart beat.

## 2014-11-24 NOTE — Addendum Note (Signed)
Addended by: Barbara CowerNOGUES, Roseline Ebarb L on: 11/24/2014 04:16 PM   Modules accepted: Orders

## 2014-11-24 NOTE — Progress Notes (Signed)
Bedside ultrasound shows CRL of 7weeks and 5days. Positive fetal heart rate on ultrasound.

## 2014-11-25 LAB — PRENATAL PROFILE (SOLSTAS)
Antibody Screen: NEGATIVE
BASOS PCT: 0 % (ref 0–1)
Basophils Absolute: 0 10*3/uL (ref 0.0–0.1)
Eosinophils Absolute: 0.1 10*3/uL (ref 0.0–0.7)
Eosinophils Relative: 1 % (ref 0–5)
HEMATOCRIT: 38 % (ref 36.0–46.0)
HEP B S AG: NEGATIVE
HIV: NONREACTIVE
Hemoglobin: 13 g/dL (ref 12.0–15.0)
Lymphocytes Relative: 23 % (ref 12–46)
Lymphs Abs: 2.4 10*3/uL (ref 0.7–4.0)
MCH: 29.7 pg (ref 26.0–34.0)
MCHC: 34.2 g/dL (ref 30.0–36.0)
MCV: 86.8 fL (ref 78.0–100.0)
MPV: 10.6 fL (ref 9.4–12.4)
Monocytes Absolute: 0.7 10*3/uL (ref 0.1–1.0)
Monocytes Relative: 7 % (ref 3–12)
NEUTROS ABS: 7.2 10*3/uL (ref 1.7–7.7)
NEUTROS PCT: 69 % (ref 43–77)
Platelets: 222 10*3/uL (ref 150–400)
RBC: 4.38 MIL/uL (ref 3.87–5.11)
RDW: 12.8 % (ref 11.5–15.5)
Rh Type: POSITIVE
Rubella: 1.97 Index — ABNORMAL HIGH (ref <0.90)
WBC: 10.4 10*3/uL (ref 4.0–10.5)

## 2014-11-25 LAB — CYTOLOGY - PAP

## 2014-11-26 LAB — CULTURE, OB URINE
Colony Count: NO GROWTH
ORGANISM ID, BACTERIA: NO GROWTH

## 2014-12-09 NOTE — L&D Delivery Note (Cosign Needed)
Delivery Note Pt progressed steadily through labor.  After a 3 minute 2nd stage, at 6:15 AM a viable female was delivered via Vaginal, Spontaneous Delivery (Presentation: Right Occiput Anterior).  APGAR: 9/9, ; weight pending . After 3 minutes, the cord was clamped and cut.  40 units of pitocin diluted in 1000cc LR was infused rapidly IV.  The placenta separated spontaneously and delivered via CCT and maternal pushing effort.  It was inspected and appears to be intact with a 3 VC.   Anesthesia: Epidural  Episiotomy: None Lacerations: None Suture Repair: n/a Est. Blood Loss (mL):  250  Mom to postpartum.  Baby to Couplet care / Skin to Skin.  Delivery by Paulina Fusi, SNM under my direct supervision  [name],Jasmine Ellis 06/30/2015, 6:32 AM

## 2014-12-21 ENCOUNTER — Encounter: Payer: Self-pay | Admitting: Obstetrics & Gynecology

## 2014-12-21 ENCOUNTER — Ambulatory Visit (INDEPENDENT_AMBULATORY_CARE_PROVIDER_SITE_OTHER): Payer: Medicaid Other | Admitting: Obstetrics & Gynecology

## 2014-12-21 VITALS — BP 109/64 | HR 92 | Wt 134.2 lb

## 2014-12-21 DIAGNOSIS — O99350 Diseases of the nervous system complicating pregnancy, unspecified trimester: Secondary | ICD-10-CM

## 2014-12-21 DIAGNOSIS — Z3491 Encounter for supervision of normal pregnancy, unspecified, first trimester: Secondary | ICD-10-CM

## 2014-12-21 DIAGNOSIS — Z3490 Encounter for supervision of normal pregnancy, unspecified, unspecified trimester: Secondary | ICD-10-CM | POA: Insufficient documentation

## 2014-12-21 DIAGNOSIS — O219 Vomiting of pregnancy, unspecified: Secondary | ICD-10-CM

## 2014-12-21 DIAGNOSIS — G40909 Epilepsy, unspecified, not intractable, without status epilepticus: Secondary | ICD-10-CM | POA: Insufficient documentation

## 2014-12-21 MED ORDER — FOLIC ACID 1 MG PO TABS: 1.0000 mg | ORAL_TABLET | Freq: Every day | ORAL | Status: DC

## 2014-12-21 MED ORDER — PROMETHAZINE HCL 25 MG PO TABS: 25.0000 mg | ORAL_TABLET | Freq: Four times a day (QID) | ORAL | Status: DC | PRN

## 2014-12-21 NOTE — Patient Instructions (Signed)
Return to clinic for any obstetric concerns or go to MAU for evaluation  

## 2014-12-21 NOTE — Addendum Note (Signed)
Addended by: Jaynie CollinsANYANWU, UGONNA A on: 12/21/2014 10:39 AM   Modules accepted: Orders

## 2014-12-21 NOTE — Progress Notes (Signed)
Discussed benefits/risks of Adderal in pregnancy, patient will continue for now History of seizures, under care of Neurologist who will start her on AED soon.  Prescribed Folic acid 1 mg po daily, she will let us know the specific AED at next visit. Quad screen next visit. Anatomy scan scheduled.   No other complaints or concerns.  Routine obstetric precautions reviewed.

## 2014-12-21 NOTE — Progress Notes (Signed)
Discussed benefits/risks of Adderal in pregnancy, patient will continue for now History of seizures, under care of Neurologist who will start her on AED soon.  Prescribed Folic acid 1 mg po daily, she will let us know the specific AED at next visit. Quad screen next visit. Anatomy scan scheduled.   Phenergan prescribed as needed for nausea as per patient's preference. No other complaints or concerns.  Routine obstetric precautions reviewed.

## 2015-01-12 ENCOUNTER — Encounter (HOSPITAL_COMMUNITY): Payer: Self-pay | Admitting: General Practice

## 2015-01-12 ENCOUNTER — Inpatient Hospital Stay (HOSPITAL_COMMUNITY)
Admission: AD | Admit: 2015-01-12 | Discharge: 2015-01-12 | Disposition: A | Discharge status: Home / Self Care | Payer: Medicaid Other | Source: Ambulatory Visit | Attending: Family Medicine | Admitting: Family Medicine

## 2015-01-12 DIAGNOSIS — O26899 Other specified pregnancy related conditions, unspecified trimester: Secondary | ICD-10-CM

## 2015-01-12 DIAGNOSIS — R109 Unspecified abdominal pain: Secondary | ICD-10-CM | POA: Diagnosis present

## 2015-01-12 DIAGNOSIS — O99332 Smoking (tobacco) complicating pregnancy, second trimester: Secondary | ICD-10-CM | POA: Diagnosis not present

## 2015-01-12 DIAGNOSIS — F17210 Nicotine dependence, cigarettes, uncomplicated: Secondary | ICD-10-CM | POA: Diagnosis not present

## 2015-01-12 DIAGNOSIS — O9989 Other specified diseases and conditions complicating pregnancy, childbirth and the puerperium: Secondary | ICD-10-CM

## 2015-01-12 DIAGNOSIS — Z3A14 14 weeks gestation of pregnancy: Secondary | ICD-10-CM | POA: Diagnosis not present

## 2015-01-12 DIAGNOSIS — Z3491 Encounter for supervision of normal pregnancy, unspecified, first trimester: Secondary | ICD-10-CM

## 2015-01-12 DIAGNOSIS — Z3A15 15 weeks gestation of pregnancy: Secondary | ICD-10-CM

## 2015-01-12 LAB — URINALYSIS, ROUTINE W REFLEX MICROSCOPIC
BILIRUBIN URINE: NEGATIVE
Glucose, UA: NEGATIVE mg/dL
HGB URINE DIPSTICK: NEGATIVE
Ketones, ur: NEGATIVE mg/dL
LEUKOCYTES UA: NEGATIVE
Nitrite: NEGATIVE
PH: 7.5 (ref 5.0–8.0)
Protein, ur: NEGATIVE mg/dL
Specific Gravity, Urine: 1.015 (ref 1.005–1.030)
UROBILINOGEN UA: 0.2 mg/dL (ref 0.0–1.0)

## 2015-01-12 LAB — WET PREP, GENITAL
CLUE CELLS WET PREP: NONE SEEN
Trich, Wet Prep: NONE SEEN
YEAST WET PREP: NONE SEEN

## 2015-01-12 NOTE — MAU Provider Note (Signed)
History     CSN: [id number]  Arrival date and time: 01/12/15 1251   First Provider Initiated Contact with Patient 01/12/15 1331      Chief Complaint  Patient presents with  . Abdominal Pain   HPI  Ms. Jasmine Ellis is a 24 y.o. G2P1001 at 10542w3d here with report of lower left sided pelvic pain x 3 days.  Pain is described as a sharp pain and rated an 6/10.  Currently rated 4/10.   Also reports yellowish vaginal discharge that's been present throughout pregnancy.  No report of vaginal bleeding or UTI symptoms. [facility].    Past Medical History  Diagnosis Date  . Migraine   . Anxiety   . Scoliosis     Past Surgical History  Procedure Laterality Date  . Finger surgery      24 yo    Family History  Problem Relation Age of Onset  . Hypertension Maternal Grandmother   . Drug abuse Maternal Grandfather   . Hypertension Paternal Grandmother   . Drug abuse Paternal Grandfather     History  Substance Use Topics  . Smoking status: Current Every Day Smoker -- 0.25 packs/day    Types: Cigarettes  . Smokeless tobacco: Never Used  . Alcohol Use: No    Allergies:  Allergies  Allergen Reactions  . Ciprofloxacin Hcl Hives    Prescriptions prior to admission  Medication Sig Dispense Refill Last Dose  . amphetamine-dextroamphetamine (ADDERALL) 10 MG tablet Take 10 mg by mouth daily with breakfast.   01/11/2015 at Unknown time  . folic acid (FOLVITE) 1 MG tablet Take 1 tablet (1 mg total) by mouth daily. 30 tablet 10 01/11/2015 at Unknown time  . Prenat w/o A Vit-FeFum-FePo-FA (CONCEPT OB) 130-92.4-1 MG CAPS Take 1 tablet by mouth daily. 30 capsule 12 01/11/2015 at Unknown time  . topiramate (TOPAMAX) 25 MG capsule Take 25 mg by mouth 2 (two) times daily.   01/11/2015 at Unknown time  . ALPRAZolam (XANAX) 1 MG tablet   0 Not Taking  . amphetamine-dextroamphetamine (ADDERALL) 20 MG tablet Take 20 mg by mouth 2 (two) times daily.  0 Not Taking  .  Doxylamine-Pyridoxine 10-10 MG TBEC Take 1 tablet by mouth 2 (two) times daily. (Patient not taking: Reported on 01/12/2015) 60 tablet 12 Taking  . promethazine (PHENERGAN) 25 MG tablet Take 1 tablet (25 mg total) by mouth every 6 (six) hours as needed for nausea or vomiting. 30 tablet 2 prn    Review of Systems  Constitutional: Negative for fever, chills and malaise/fatigue.  Gastrointestinal: Positive for nausea and abdominal pain. Negative for vomiting, diarrhea and constipation.  Genitourinary: Negative for dysuria, urgency and frequency.       Yellow vaginal discharge  All other systems reviewed and are negative.  Physical Exam   Blood pressure 119/60, pulse 97, temperature 98 F (36.7 C), temperature source Oral, resp. rate 18, height 5\' 2"  (1.575 m), weight 61.292 kg (135 lb 2 oz), last menstrual period 10/03/2014, SpO2 100 %.  Physical Exam  Constitutional: She is oriented to person, place, and time. She appears well-developed and well-nourished. No distress.  HENT:  Head: Normocephalic.  Neck: Normal range of motion. Neck supple.  Cardiovascular: Normal rate, regular rhythm and normal heart sounds.   Respiratory: Effort normal and breath sounds normal.  Genitourinary: No bleeding in the vagina. Vaginal discharge (mucusy) found.  Neurological: She is alert and oriented to person, place, and time.  Skin:  Skin is warm and dry.   FHR 153  Dilation: Closed Effacement (%): Thick Cervical Position: Posterior Exam by:: Margarita Mail, CNM  Results for orders placed or performed during the hospital encounter of 01/12/15 (from the past 24 hour(s))  Urinalysis, Routine w reflex microscopic     Status: Abnormal   Collection Time: 01/12/15  1:09 PM  Result Value Ref Range   Color, Urine YELLOW YELLOW   APPearance HAZY (A) CLEAR   Specific Gravity, Urine 1.015 1.005 - 1.030   pH 7.5 5.0 - 8.0   Glucose, UA NEGATIVE NEGATIVE mg/dL   Hgb urine dipstick NEGATIVE NEGATIVE   Bilirubin Urine  NEGATIVE NEGATIVE   Ketones, ur NEGATIVE NEGATIVE mg/dL   Protein, ur NEGATIVE NEGATIVE mg/dL   Urobilinogen, UA 0.2 0.0 - 1.0 mg/dL   Nitrite NEGATIVE NEGATIVE   Leukocytes, UA NEGATIVE NEGATIVE    MAU Course  Procedures Results for orders placed or performed during the hospital encounter of 01/12/15 (from the past 24 hour(s))  Urinalysis, Routine w reflex microscopic     Status: Abnormal   Collection Time: 01/12/15  1:09 PM  Result Value Ref Range   Color, Urine YELLOW YELLOW   APPearance HAZY (A) CLEAR   Specific Gravity, Urine 1.015 1.005 - 1.030   pH 7.5 5.0 - 8.0   Glucose, UA NEGATIVE NEGATIVE mg/dL   Hgb urine dipstick NEGATIVE NEGATIVE   Bilirubin Urine NEGATIVE NEGATIVE   Ketones, ur NEGATIVE NEGATIVE mg/dL   Protein, ur NEGATIVE NEGATIVE mg/dL   Urobilinogen, UA 0.2 0.0 - 1.0 mg/dL   Nitrite NEGATIVE NEGATIVE   Leukocytes, UA NEGATIVE NEGATIVE  Wet prep, genital     Status: Abnormal   Collection Time: 01/12/15  1:51 PM  Result Value Ref Range   Yeast Wet Prep HPF POC NONE SEEN NONE SEEN   Trich, Wet Prep NONE SEEN NONE SEEN   Clue Cells Wet Prep HPF POC NONE SEEN NONE SEEN   WBC, Wet Prep HPF POC FEW (A) NONE SEEN     Assessment and Plan  23 y.o. G2P1001 at [redacted]w[redacted]d IUP Abdominal Pain in Pregnancy - Normal Exam  Plan: Discharge to home Provide reassurance Keep scheduled appointment Reviewed warning signs of Pregnancy  Rochele Pages N 01/12/2015, 1:34 PM

## 2015-01-12 NOTE — Discharge Instructions (Signed)
Abdominal Pain During Pregnancy °Belly (abdominal) pain is common during pregnancy. Most of the time, it is not a serious problem. Other times, it can be a sign that something is wrong with the pregnancy. Always tell your doctor if you have belly pain. °HOME CARE °Monitor your belly pain for any changes. The following actions may help you feel better: °· Do not have sex (intercourse) or put anything in your vagina until you feel better. °· Rest until your pain stops. °· Drink clear fluids if you feel sick to your stomach (nauseous). Do not eat solid food until you feel better. °· Only take medicine as told by your doctor. °· Keep all doctor visits as told. °GET HELP RIGHT AWAY IF:  °· You are bleeding, leaking fluid, or pieces of tissue come out of your vagina. °· You have more pain or cramping. °· You keep throwing up (vomiting). °· You have pain when you pee (urinate) or have blood in your pee. °· You have a fever. °· You do not feel your baby moving as much. °· You feel very weak or feel like passing out. °· You have trouble breathing, with or without belly pain. °· You have a very bad headache and belly pain. °· You have fluid leaking from your vagina and belly pain. °· You keep having watery poop (diarrhea). °· Your belly pain does not go away after resting, or the pain gets worse. °MAKE SURE YOU:  °· Understand these instructions. °· Will watch your condition. °· Will get help right away if you are not doing well or get worse. °Document Released: 11/13/2009 Document Revised: 07/28/2013 Document Reviewed: 06/24/2013 °ExitCare® Patient Information ©2015 [employer]. This information is not intended to replace advice given to you by your health care provider. Make sure you discuss any questions you have with your health care provider. ° °

## 2015-01-12 NOTE — MAU Note (Signed)
Pt c/o a sharp pain in her left side of her abd. Has had pain for 3 days.  Denies vag bleeding reprots some normal vag discharge.

## 2015-01-13 LAB — GC/CHLAMYDIA PROBE AMP (~~LOC~~) NOT AT ARMC
Chlamydia: NEGATIVE
Neisseria Gonorrhea: NEGATIVE

## 2015-01-18 ENCOUNTER — Encounter: Payer: Self-pay | Admitting: Obstetrics & Gynecology

## 2015-01-18 ENCOUNTER — Ambulatory Visit (INDEPENDENT_AMBULATORY_CARE_PROVIDER_SITE_OTHER): Payer: Medicaid Other | Admitting: Obstetrics & Gynecology

## 2015-01-18 VITALS — BP 117/69 | HR 87 | Wt 136.4 lb

## 2015-01-18 DIAGNOSIS — Z3492 Encounter for supervision of normal pregnancy, unspecified, second trimester: Secondary | ICD-10-CM

## 2015-01-18 NOTE — Progress Notes (Signed)
Neurologist started Topamax, will continue to follow along with us. Continue folic acid. Quad screen today; anatomy scan already scheduled No other complaints or concerns.  Routine obstetric precautions reviewed.

## 2015-01-18 NOTE — Patient Instructions (Addendum)
Return to clinic for any obstetric concerns or go to MAU for evaluation Second Trimester of Pregnancy The second trimester is from week 13 through week 28, months 4 through 6. The second trimester is often a time when you feel your best. Your body has also adjusted to being pregnant, and you begin to feel better physically. Usually, morning sickness has lessened or quit completely, you may have more energy, and you may have an increase in appetite. The second trimester is also a time when the fetus is growing rapidly. At the end of the sixth month, the fetus is about 9 inches long and weighs about 1 pounds. You will likely begin to feel the baby move (quickening) between 18 and 20 weeks of the pregnancy. BODY CHANGES Your body goes through many changes during pregnancy. The changes vary from woman to woman.   Your weight will continue to increase. You will notice your lower abdomen bulging out.  You may begin to get stretch marks on your hips, abdomen, and breasts.  You may develop headaches that can be relieved by medicines approved by your health care provider.  You may urinate more often because the fetus is pressing on your bladder.  You may develop or continue to have heartburn as a result of your pregnancy.  You may develop constipation because certain hormones are causing the muscles that push waste through your intestines to slow down.  You may develop hemorrhoids or swollen, bulging veins (varicose veins).  You may have back pain because of the weight gain and pregnancy hormones relaxing your joints between the bones in your pelvis and as a result of a shift in weight and the muscles that support your balance.  Your breasts will continue to grow and be tender.  Your gums may bleed and may be sensitive to brushing and flossing.  Dark spots or blotches (chloasma, mask of pregnancy) may develop on your face. This will likely fade after the baby is born.  A dark line from your belly  button to the pubic area (linea nigra) may appear. This will likely fade after the baby is born.  You may have changes in your hair. These can include thickening of your hair, rapid growth, and changes in texture. Some women also have hair loss during or after pregnancy, or hair that feels dry or thin. Your hair will most likely return to normal after your baby is born. WHAT TO EXPECT AT YOUR PRENATAL VISITS During a routine prenatal visit:  You will be weighed to make sure you and the fetus are growing normally.  Your blood pressure will be taken.  Your abdomen will be measured to track your baby's growth.  The fetal heartbeat will be listened to.  Any test results from the previous visit will be discussed. Your health care provider may ask you:  How you are feeling.  If you are feeling the baby move.  If you have had any abnormal symptoms, such as leaking fluid, bleeding, severe headaches, or abdominal cramping.  If you have any questions. Other tests that may be performed during your second trimester include:  Blood tests that check for:  Low iron levels (anemia).  Gestational diabetes (between 24 and 28 weeks).  Rh antibodies.  Urine tests to check for infections, diabetes, or protein in the urine.  An ultrasound to confirm the proper growth and development of the baby.  An amniocentesis to check for possible genetic problems.  Fetal screens for spina bifida and Down  syndrome. HOME CARE INSTRUCTIONS   Avoid all smoking, herbs, alcohol, and unprescribed drugs. These chemicals affect the formation and growth of the baby.  Follow your health care provider's instructions regarding medicine use. There are medicines that are either safe or unsafe to take during pregnancy.  Exercise only as directed by your health care provider. Experiencing uterine cramps is a good sign to stop exercising.  Continue to eat regular, healthy meals.  Wear a good support bra for breast  tenderness.  Do not use hot tubs, steam rooms, or saunas.  Wear your seat belt at all times when driving.  Avoid raw meat, uncooked cheese, cat litter boxes, and soil used by cats. These carry germs that can cause birth defects in the baby.  Take your prenatal vitamins.  Try taking a stool softener (if your health care provider approves) if you develop constipation. Eat more high-fiber foods, such as fresh vegetables or fruit and whole grains. Drink plenty of fluids to keep your urine clear or pale yellow.  Take warm sitz baths to soothe any pain or discomfort caused by hemorrhoids. Use hemorrhoid cream if your health care provider approves.  If you develop varicose veins, wear support hose. Elevate your feet for 15 minutes, 3-4 times a day. Limit salt in your diet.  Avoid heavy lifting, wear low heel shoes, and practice good posture.  Rest with your legs elevated if you have leg cramps or low back pain.  Visit your dentist if you have not gone yet during your pregnancy. Use a soft toothbrush to brush your teeth and be gentle when you floss.  A sexual relationship may be continued unless your health care provider directs you otherwise.  Continue to go to all your prenatal visits as directed by your health care provider. SEEK MEDICAL CARE IF:   You have dizziness.  You have mild pelvic cramps, pelvic pressure, or nagging pain in the abdominal area.  You have persistent nausea, vomiting, or diarrhea.  You have a bad smelling vaginal discharge.  You have pain with urination. SEEK IMMEDIATE MEDICAL CARE IF:   You have a fever.  You are leaking fluid from your vagina.  You have spotting or bleeding from your vagina.  You have severe abdominal cramping or pain.  You have rapid weight gain or loss.  You have shortness of breath with chest pain.  You notice sudden or extreme swelling of your face, hands, ankles, feet, or legs.  You have not felt your baby move in over an  hour.  You have severe headaches that do not go away with medicine.  You have vision changes. Document Released: 11/19/2001 Document Revised: 11/30/2013 Document Reviewed: 01/26/2013 [facility] Patient Information 2015 [place], [place]. This information is not intended to replace advice given to you by your health care provider. Make sure you discuss any questions you have with your health care provider.

## 2015-01-19 LAB — AFP, QUAD SCREEN
AFP: 19.2 ng/mL
Age Alone: 1:1130 {titer}
CURR GEST AGE: 15.4 wks.days
Down Syndrome Scr Risk Est: 1:225 {titer}
HCG, Total: 36.79 IU/mL
INH: 420.6 pg/mL
INTERPRETATION-AFP: POSITIVE — AB
MOM FOR HCG: 0.8
MoM for AFP: 0.57
MoM for INH: 2.18
OPEN SPINA BIFIDA: NEGATIVE
TRI 18 SCR RISK EST: NEGATIVE
Trisomy 18 (Edward) Syndrome Interp.: 1:32100 {titer}
UE3 VALUE: 0.82 ng/mL
uE3 Mom: 1.15

## 2015-01-23 ENCOUNTER — Other Ambulatory Visit (INDEPENDENT_AMBULATORY_CARE_PROVIDER_SITE_OTHER): Payer: Medicaid Other | Admitting: *Deleted

## 2015-01-23 DIAGNOSIS — O289 Unspecified abnormal findings on antenatal screening of mother: Secondary | ICD-10-CM

## 2015-01-23 DIAGNOSIS — O280 Abnormal hematological finding on antenatal screening of mother: Secondary | ICD-10-CM

## 2015-02-07 ENCOUNTER — Encounter (HOSPITAL_COMMUNITY): Payer: Medicaid Other

## 2015-02-07 ENCOUNTER — Ambulatory Visit (HOSPITAL_COMMUNITY): Payer: No Typology Code available for payment source

## 2015-02-08 ENCOUNTER — Encounter: Payer: Self-pay | Admitting: Obstetrics and Gynecology

## 2015-02-08 ENCOUNTER — Ambulatory Visit (INDEPENDENT_AMBULATORY_CARE_PROVIDER_SITE_OTHER): Payer: Medicaid Other | Admitting: Obstetrics and Gynecology

## 2015-02-08 VITALS — BP 128/95 | HR 95 | Wt 140.0 lb

## 2015-02-08 DIAGNOSIS — Z3492 Encounter for supervision of normal pregnancy, unspecified, second trimester: Secondary | ICD-10-CM

## 2015-02-08 DIAGNOSIS — O99350 Diseases of the nervous system complicating pregnancy, unspecified trimester: Secondary | ICD-10-CM

## 2015-02-08 DIAGNOSIS — G40909 Epilepsy, unspecified, not intractable, without status epilepticus: Secondary | ICD-10-CM

## 2015-02-08 NOTE — Progress Notes (Signed)
Patient is doing well without complaints. Patient scheduled for ultrasound and genetic counseling tomorrow. NIPS was normal Elevated BP today- monitor closely Continue Topamax

## 2015-02-09 ENCOUNTER — Ambulatory Visit (HOSPITAL_COMMUNITY)
Admission: RE | Admit: 2015-02-09 | Discharge: 2015-02-09 | Disposition: A | Discharge status: Home / Self Care | Payer: Medicaid Other | Source: Ambulatory Visit | Attending: Obstetrics & Gynecology | Admitting: Obstetrics & Gynecology

## 2015-02-09 DIAGNOSIS — Z3A18 18 weeks gestation of pregnancy: Secondary | ICD-10-CM | POA: Insufficient documentation

## 2015-02-09 DIAGNOSIS — Z315 Encounter for genetic counseling: Secondary | ICD-10-CM | POA: Diagnosis present

## 2015-02-09 DIAGNOSIS — O351XX0 Maternal care for (suspected) chromosomal abnormality in fetus, not applicable or unspecified: Secondary | ICD-10-CM | POA: Diagnosis not present

## 2015-02-09 DIAGNOSIS — O99330 Smoking (tobacco) complicating pregnancy, unspecified trimester: Secondary | ICD-10-CM | POA: Insufficient documentation

## 2015-02-09 DIAGNOSIS — O280 Abnormal hematological finding on antenatal screening of mother: Secondary | ICD-10-CM | POA: Insufficient documentation

## 2015-02-09 DIAGNOSIS — Z3491 Encounter for supervision of normal pregnancy, unspecified, first trimester: Secondary | ICD-10-CM

## 2015-02-09 DIAGNOSIS — O283 Abnormal ultrasonic finding on antenatal screening of mother: Secondary | ICD-10-CM | POA: Insufficient documentation

## 2015-02-09 DIAGNOSIS — O358XX0 Maternal care for other (suspected) fetal abnormality and damage, not applicable or unspecified: Secondary | ICD-10-CM

## 2015-02-09 LAB — CHROMOSOMES ANALYSIS FOR CF

## 2015-02-09 NOTE — Progress Notes (Signed)
Genetic Counseling  High-Risk Gestation Note  Appointment Date:  02/09/2015 Referred By: Tereso NewcomerAnyanwu, Ugonna A, MD Date of Birth:  Jan 20, 1991 Partner:  Jasmine Ellis   Pregnancy History: G2P1001 Estimated Date of Delivery: 07/10/15 Estimated Gestational Age: 6661w3d Attending: Damaris HippoJeffrey Denney, MD   Jasmine Ellis and her partner, Mr. Jasmine KaufmanRadley Ellis, were seen for genetic counseling because of an increased risk for fetal Down syndrome based on Quad screening through [facility].  They were counseled regarding the Quad screen result and the associated 1 in 225 risk for fetal Down syndrome.  We reviewed chromosomes, nondisjunction, and the common features and variable prognosis of Down syndrome.  In addition, we reviewed the screen adjusted reduction in risks for trisomy 18 (1 in 32,100) and ONTDs (<1 in 27,300).  We also discussed other explanations for a screen positive result including: a gestational dating error, differences in maternal metabolism, and normal variation. They understand that this screening is not diagnostic for Down syndrome but provides a risk assessment.  Jasmine Ellis had noninvasive prenatal screening (NIPS)/cell free DNA through her OB office which was within normal limits for the conditions screened, showing a less than 1 in 10,000 risk for trisomies 9121, 1018 and 7813, and monosomy X (Turner syndrome).  In addition, the risk for triploidy/vanishing twin and sex chromosome trisomies (47,XXX and 47,XXY) was also low risk.  Expanded testing for microdeletions was also performed on [facility] through [facility]. We reviewed that this testing identifies > 99% of pregnancies with trisomy 8121, trisomy 4213, sex chromosome trisomies (47,XXX and 47,XXY), and triploidy. The detection rate for trisomy 18 is 96%.  We discussed that this is not diagnostic for these conditions. She understands that this testing does not identify all genetic conditions.   [facility]. Fetal echogenic bowel was visualized at this time. Complete ultrasound results reported separately. Echogenic bowel was identified at the time of today's ultrasound. The ultrasound report will be sent under separate cover. We discussed that an echogenic bowel refers to an area in the fetal intestines that appears brighter, or denser, than the liver and/or bone on ultrasound.   Echogenic bowel is detected in approximately 1% of fetuses at the time of a second trimester ultrasound evaluation.  Most of the time, this brightness does not cause any problems and will spontaneously resolve.  Possible causes of an echogenic bowel include undergrowth of the bowel, an obstruction or blockage of the intestines, the fetus swallowing a small amount of blood present in the amniotic fluid, an intrauterine infection, a chromosome condition, cystic fibrosis, or a normal variation in development.   Jasmine Ellis reported no bleeding during the pregnancy. She also denied known exposure to infections during the pregnancy. We briefly reviewed cystic fibrosis (CF) including the typical features and prognosis and the autosomal recessive inheritance of the condition. The carrier frequency in the Caucasian population is approximately 1 in 2829.  The presence of echogenic bowel on prenatal ultrasound is associated with an increased risk for CF. We discussed the availability of carrier testing and prenatal diagnosis if indicated.  In addition, we discussed that CF is routinely screened for as part of the [place] newborn screening panel.  She elected to proceed with CF carrier screening at the time of today's visit.   We discussed the diagnostic testing option for chromosome conditions of amniocentesis. We discussed the risks, benefits, and limitations including the associated 1 in 300-500 risk for complications, including spontaneous pregnancy loss.  In addition to chromosome analysis, infection studies can  also be performed on amniotic fluid. After consideration of all the options, she elected to proceed with maternal TORCH titers and declined amniocentesis at this time. A follow-up ultrasound was planned in 4 weeks.   Both family histories were reviewed and found to be contributory for seizures for the patient of unknown etiology. Epilepsy occurs in approximately 1% of the population and can have many causes.  Approximately 80% of epilepsy is thought to be idiopathic while the remaining 20% is secondary to a variety of factors such as perinatal events, infections, trauma and genetic disease.  A specific diagnosis in an affected individual is necessary to accurately assess the risk for other family members to develop epilepsy.  In the absence of a known etiology, epilepsy is thought to be caused by a combination of genetic and environmental factors, called multifactorial inheritance. Recurrence risk for epilepsy is estimated to be 4% for offspring of an individual with primary idiopathic epilepsy. It would be important for the couple's pediatrician to be aware of this history so that their child(ren) can be screened and followed appropriately.  Without further information regarding the provided family history, an accurate genetic risk cannot be calculated. Further genetic counseling is warranted if more information is obtained.  Jasmine Ellis denied exposure to environmental toxins or chemical agents. She denied the use of alcohol, tobacco or street drugs. She denied significant viral illnesses during the course of her pregnancy. Her medical and surgical histories were contributory for seizures for which she is treated with Topamax. Topiramate (Topamax) use in pregnancy has been associated with an increased incidence of birth defects in animal study data.  Available data from human use have conflicting reports regarding pregnancy outcomes as human study data have identified both normal and abnormal pregnancy outcome  after topiramate exposure. An association with increased incidence of oral clefts was reported in one registry population and a case control study.   I counseled this couple for approximately 40 minutes regarding the above risks and available options.   Quinn Plowman, MS,  Certified Genetic Counselor 02/09/2015

## 2015-02-10 LAB — TOXOPLASMA ANTIBODIES- IGG AND  IGM: Toxoplasma IgG Ratio: <3 IU/mL (ref 0.0–7.1)

## 2015-02-10 LAB — CMV IGM: CMV IgM: <30 AU/mL (ref 0.0–29.9)

## 2015-02-10 LAB — CMV ANTIBODY, IGG (EIA)

## 2015-02-13 ENCOUNTER — Other Ambulatory Visit (HOSPITAL_COMMUNITY): Payer: Self-pay | Admitting: Obstetrics & Gynecology

## 2015-02-13 LAB — PARVOVIRUS B19 ANTIBODY, IGG AND IGM
PAROVIRUS B19 IGG ABS: 0.3 {index} (ref <0.9)
PAROVIRUS B19 IGM ABS: 0.2 {index} (ref <0.9)

## 2015-02-16 ENCOUNTER — Telehealth (HOSPITAL_COMMUNITY): Payer: Self-pay | Admitting: MS"

## 2015-02-16 ENCOUNTER — Other Ambulatory Visit (HOSPITAL_COMMUNITY): Payer: Self-pay | Admitting: Obstetrics and Gynecology

## 2015-02-16 DIAGNOSIS — F17210 Nicotine dependence, cigarettes, uncomplicated: Secondary | ICD-10-CM

## 2015-02-16 DIAGNOSIS — Z3A22 22 weeks gestation of pregnancy: Secondary | ICD-10-CM

## 2015-02-16 DIAGNOSIS — O289 Unspecified abnormal findings on antenatal screening of mother: Secondary | ICD-10-CM

## 2015-02-16 NOTE — Telephone Encounter (Signed)
Called Ms. Burnett KanarisMorgan P Hawkey to review results of maternal TORCH titers and cystic fibrosis carrier screening. Both were offered because of ultrasound finding of fetal echogenic bowel. Patient identified by name and DOB. Carrier screening for cystic fibrosis yielded a normal/negative result for the 32 most common disease-causing mutations, meaning that the risk to be a CF carrier can be reduced from 1 in 25 to approximately 1 in 250.  Therefore, the risk for cystic fibrosis in her current pregnancy is significantly reduced. The patient understands that CF carrier screening can reduce but not eliminate the chance to be a CF carrier.   Additionally discussed that CMV, toxoplasmosis, and parvo titers were within normal limits.   The patient understands this does not assess for all conditions or birth defects. Follow-up ultrasound is scheduled.   Clydie BraunKaren Lizann Edelman  02/16/2015 10:15 AM

## 2015-02-16 NOTE — Telephone Encounter (Signed)
Left message for patient to return call.   Clydie BraunKaren Kamiyah Kindel 02/16/2015 10:07 AM

## 2015-03-08 ENCOUNTER — Encounter: Payer: Medicaid Other | Admitting: Obstetrics & Gynecology

## 2015-03-10 ENCOUNTER — Ambulatory Visit (HOSPITAL_COMMUNITY): Admission: RE | Admit: 2015-03-10 | Payer: Medicaid Other | Source: Ambulatory Visit

## 2015-03-10 ENCOUNTER — Encounter: Payer: Self-pay | Admitting: Obstetrics & Gynecology

## 2015-03-10 ENCOUNTER — Ambulatory Visit (INDEPENDENT_AMBULATORY_CARE_PROVIDER_SITE_OTHER): Payer: Medicaid Other | Admitting: Obstetrics & Gynecology

## 2015-03-10 VITALS — BP 114/72 | HR 108 | Wt 154.0 lb

## 2015-03-10 DIAGNOSIS — Z3490 Encounter for supervision of normal pregnancy, unspecified, unspecified trimester: Secondary | ICD-10-CM

## 2015-03-10 NOTE — Progress Notes (Signed)
Routine visit. Good FM. No problems. We discussed the recommendations for weight gain. At this time, she declines to see a nutritionist.

## 2015-03-16 ENCOUNTER — Encounter (HOSPITAL_BASED_OUTPATIENT_CLINIC_OR_DEPARTMENT_OTHER): Payer: Self-pay | Admitting: *Deleted

## 2015-03-16 ENCOUNTER — Emergency Department (HOSPITAL_BASED_OUTPATIENT_CLINIC_OR_DEPARTMENT_OTHER): Payer: Medicaid Other

## 2015-03-16 ENCOUNTER — Emergency Department (HOSPITAL_BASED_OUTPATIENT_CLINIC_OR_DEPARTMENT_OTHER)
Admission: EM | Admit: 2015-03-16 | Discharge: 2015-03-17 | Disposition: A | Discharge status: Home / Self Care | Payer: Medicaid Other | Attending: Emergency Medicine | Admitting: Emergency Medicine

## 2015-03-16 DIAGNOSIS — Z8659 Personal history of other mental and behavioral disorders: Secondary | ICD-10-CM | POA: Insufficient documentation

## 2015-03-16 DIAGNOSIS — R51 Headache: Secondary | ICD-10-CM | POA: Insufficient documentation

## 2015-03-16 DIAGNOSIS — Z8739 Personal history of other diseases of the musculoskeletal system and connective tissue: Secondary | ICD-10-CM | POA: Insufficient documentation

## 2015-03-16 DIAGNOSIS — O99332 Smoking (tobacco) complicating pregnancy, second trimester: Secondary | ICD-10-CM | POA: Insufficient documentation

## 2015-03-16 DIAGNOSIS — Z3A22 22 weeks gestation of pregnancy: Secondary | ICD-10-CM | POA: Insufficient documentation

## 2015-03-16 DIAGNOSIS — R05 Cough: Secondary | ICD-10-CM

## 2015-03-16 DIAGNOSIS — R5383 Other fatigue: Secondary | ICD-10-CM | POA: Insufficient documentation

## 2015-03-16 DIAGNOSIS — Z79899 Other long term (current) drug therapy: Secondary | ICD-10-CM | POA: Diagnosis not present

## 2015-03-16 DIAGNOSIS — O9989 Other specified diseases and conditions complicating pregnancy, childbirth and the puerperium: Secondary | ICD-10-CM | POA: Diagnosis not present

## 2015-03-16 DIAGNOSIS — F17210 Nicotine dependence, cigarettes, uncomplicated: Secondary | ICD-10-CM | POA: Diagnosis not present

## 2015-03-16 DIAGNOSIS — Z8679 Personal history of other diseases of the circulatory system: Secondary | ICD-10-CM | POA: Insufficient documentation

## 2015-03-16 DIAGNOSIS — R059 Cough, unspecified: Secondary | ICD-10-CM

## 2015-03-16 NOTE — ED Notes (Signed)
Productive cough x 3 days with yellow sputum.

## 2015-03-17 LAB — URINALYSIS, ROUTINE W REFLEX MICROSCOPIC
Bilirubin Urine: NEGATIVE
Glucose, UA: NEGATIVE mg/dL
Hgb urine dipstick: NEGATIVE
Ketones, ur: NEGATIVE mg/dL
Leukocytes, UA: NEGATIVE
Nitrite: NEGATIVE
Protein, ur: NEGATIVE mg/dL
SPECIFIC GRAVITY, URINE: 1.02 (ref 1.005–1.030)
UROBILINOGEN UA: 1 mg/dL (ref 0.0–1.0)
pH: 6.5 (ref 5.0–8.0)

## 2015-03-17 MED ORDER — ALBUTEROL SULFATE HFA 108 (90 BASE) MCG/ACT IN AERS
2.0000 | INHALATION_SPRAY | RESPIRATORY_TRACT | Status: DC | PRN
  Administered 2015-03-17: 2 via RESPIRATORY_TRACT
  Filled 2015-03-17: qty 6.7

## 2015-03-17 NOTE — ED Provider Notes (Addendum)
CSN: [id number]     Arrival date & time 03/16/15  2259 History   First MD Initiated Contact with Patient 03/17/15 0113     Chief Complaint  Patient presents with  . Cough     (Consider location/radiation/quality/duration/timing/severity/associated sxs/prior Treatment) HPI  This is a 24 year old female, [redacted] weeks pregnant. She is here with a four-day history of cough productive of yellow sputum. It is been associated with generalized fatigue, headache and chest/upper back soreness when coughing. She has had nasal congestion but no sore throat. She denies fever or chills. She continues to have low back pain but attributes this to her pregnancy.  Past Medical History  Diagnosis Date  . Migraine   . Anxiety   . Scoliosis    Past Surgical History  Procedure Laterality Date  . Finger surgery      [age] yo   Family History  Problem Relation Age of Onset  . Hypertension Maternal Grandmother   . Drug abuse Maternal Grandfather   . Hypertension Paternal Grandmother   . Drug abuse Paternal Grandfather    History  Substance Use Topics  . Smoking status: Current Every Day Smoker -- 0.25 packs/day    Types: Cigarettes  . Smokeless tobacco: Never Used  . Alcohol Use: No   OB History    Gravida Para Term Preterm AB TAB SAB Ectopic Multiple Living   2 1 1       1      Review of Systems  All other systems reviewed and are negative.   Allergies  Ciprofloxacin hcl  Home Medications   Prior to Admission medications   Medication Sig Start Date End Date Taking? Authorizing Provider  amphetamine-dextroamphetamine (ADDERALL) 10 MG tablet Take 10 mg by mouth daily with breakfast.    Historical Provider, MD  folic acid (FOLVITE) 1 MG tablet Take 1 tablet (1 mg total) by mouth daily. 12/21/14   Tereso NewcomerUgonna A Anyanwu, MD  Prenat w/o A Vit-FeFum-FePo-FA (CONCEPT OB) 130-92.4-1 MG CAPS Take 1 tablet by mouth daily. 11/08/14   Dorathy KinsmanVirginia Smith, CNM  promethazine (PHENERGAN) 25 MG tablet Take 1 tablet (25  mg total) by mouth every 6 (six) hours as needed for nausea or vomiting. 12/21/14   Tereso NewcomerUgonna A Anyanwu, MD  pyridOXINE (VITAMIN B-6) 100 MG tablet Take 100 mg by mouth daily.    Historical Provider, MD  topiramate (TOPAMAX) 25 MG capsule Take 25 mg by mouth 2 (two) times daily.    Historical Provider, MD   BP 123/64 mmHg  Pulse 110  Temp(Src) 97.5 F (36.4 C) (Oral)  Resp 20  Ht 5' (1.524 m)  Wt 150 lb (68.04 kg)  BMI 29.30 kg/m2  SpO2 100%  LMP 10/03/2014   Physical Exam  General: Well-developed, well-nourished female in no acute distress; appearance consistent with age of record HENT: normocephalic; atraumatic; pharynx normal Eyes: pupils equal, round and reactive to light; extraocular muscles intact Neck: supple Heart: regular rate and rhythm Lungs: clear to auscultation bilaterally Abdomen: soft; nontender; gravid, consistent with dates Extremities: No deformity; full range of motion; pulses normal Neurologic: Awake, alert and oriented; motor function intact in all extremities and symmetric; no facial droop Skin: Warm and dry Psychiatric: Normal mood and affect   ED Course  Procedures (including critical care time)   MDM   Nursing notes and vitals signs, including pulse oximetry, reviewed.  Summary of this visit's results, reviewed by myself:  Labs:  Results for orders placed or performed during the hospital encounter of 03/16/15 (  from the past 24 hour(s))  Urinalysis, Routine w reflex microscopic     Status: Abnormal   Collection Time: 03/17/15 12:15 AM  Result Value Ref Range   Color, Urine YELLOW YELLOW   APPearance CLOUDY (A) CLEAR   Specific Gravity, Urine 1.020 1.005 - 1.030   pH 6.5 5.0 - 8.0   Glucose, UA NEGATIVE NEGATIVE mg/dL   Hgb urine dipstick NEGATIVE NEGATIVE   Bilirubin Urine NEGATIVE NEGATIVE   Ketones, ur NEGATIVE NEGATIVE mg/dL   Protein, ur NEGATIVE NEGATIVE mg/dL   Urobilinogen, UA 1.0 0.0 - 1.0 mg/dL   Nitrite NEGATIVE NEGATIVE    Leukocytes, UA NEGATIVE NEGATIVE    Imaging Studies: Dg Chest 2 View  03/17/2015   CLINICAL DATA:  Cough and congestion for 3 days. Smoker. Twenty-two weeks pregnant and shielded.  EXAM: CHEST  2 VIEW  COMPARISON:  08/30/2006  FINDINGS: The heart size and mediastinal contours are within normal limits. Both lungs are clear. The visualized skeletal structures are unremarkable.  IMPRESSION: No active cardiopulmonary disease.   Electronically Signed   By: Burman Nieves M.D.   On: 03/17/2015 00:02   The patient was advised that this could represent a viral illness or seasonal allergies. She was advised that over-the-counter Zyrtec should be safe in pregnancy as it is category B. We will provide an inhaler for relief of her symptoms as well. We wish to avoid narcotic cough medication.   Paula Libra, MD 03/17/15 1610  Paula Libra, MD 03/17/15 [phone]

## 2015-03-17 NOTE — ED Notes (Signed)
Pt c/o cough, productive, congestion, ha x 4 days

## 2015-03-21 ENCOUNTER — Other Ambulatory Visit (HOSPITAL_COMMUNITY): Payer: Self-pay | Admitting: Obstetrics and Gynecology

## 2015-03-21 ENCOUNTER — Encounter (HOSPITAL_COMMUNITY): Payer: Self-pay

## 2015-03-21 ENCOUNTER — Ambulatory Visit (HOSPITAL_COMMUNITY)
Admission: RE | Admit: 2015-03-21 | Discharge: 2015-03-21 | Disposition: A | Discharge status: Home / Self Care | Payer: Medicaid Other | Source: Ambulatory Visit | Attending: Obstetrics and Gynecology | Admitting: Obstetrics and Gynecology

## 2015-03-21 DIAGNOSIS — O289 Unspecified abnormal findings on antenatal screening of mother: Secondary | ICD-10-CM

## 2015-03-21 DIAGNOSIS — Z3A24 24 weeks gestation of pregnancy: Secondary | ICD-10-CM | POA: Insufficient documentation

## 2015-03-21 DIAGNOSIS — Z3A22 22 weeks gestation of pregnancy: Secondary | ICD-10-CM | POA: Insufficient documentation

## 2015-03-21 DIAGNOSIS — Z720 Tobacco use: Secondary | ICD-10-CM | POA: Diagnosis not present

## 2015-03-21 DIAGNOSIS — F17210 Nicotine dependence, cigarettes, uncomplicated: Secondary | ICD-10-CM

## 2015-03-21 DIAGNOSIS — Z3A28 28 weeks gestation of pregnancy: Secondary | ICD-10-CM

## 2015-04-07 ENCOUNTER — Encounter: Payer: Medicaid Other | Admitting: Obstetrics and Gynecology

## 2015-04-14 ENCOUNTER — Ambulatory Visit (INDEPENDENT_AMBULATORY_CARE_PROVIDER_SITE_OTHER): Payer: Medicaid Other | Admitting: Obstetrics & Gynecology

## 2015-04-14 VITALS — BP 109/72 | HR 106 | Wt 165.0 lb

## 2015-04-14 DIAGNOSIS — Z36 Encounter for antenatal screening of mother: Secondary | ICD-10-CM | POA: Diagnosis not present

## 2015-04-14 DIAGNOSIS — Z3492 Encounter for supervision of normal pregnancy, unspecified, second trimester: Secondary | ICD-10-CM

## 2015-04-14 DIAGNOSIS — Z23 Encounter for immunization: Secondary | ICD-10-CM | POA: Diagnosis not present

## 2015-04-14 LAB — CBC
HEMATOCRIT: 29.9 % — AB (ref 36.0–46.0)
Hemoglobin: 10.2 g/dL — ABNORMAL LOW (ref 12.0–15.0)
MCH: 30.7 pg (ref 26.0–34.0)
MCHC: 34.1 g/dL (ref 30.0–36.0)
MCV: 90.1 fL (ref 78.0–100.0)
MPV: 10.1 fL (ref 8.6–12.4)
Platelets: 229 10*3/uL (ref 150–400)
RBC: 3.32 MIL/uL — AB (ref 3.87–5.11)
RDW: 12.8 % (ref 11.5–15.5)
WBC: 11 10*3/uL — ABNORMAL HIGH (ref 4.0–10.5)

## 2015-04-14 NOTE — Progress Notes (Signed)
Routine visit. Good FM. She requests a diflucan as she recently took an ABX after dental work. Routine labs, tdap today.

## 2015-04-15 LAB — GLUCOSE TOLERANCE, 1 HOUR (50G) W/O FASTING: Glucose, 1 Hour GTT: 90 mg/dL (ref 70–140)

## 2015-04-15 LAB — HIV ANTIBODY (ROUTINE TESTING W REFLEX): HIV 1&2 Ab, 4th Generation: NONREACTIVE

## 2015-04-15 LAB — RPR

## 2015-05-02 ENCOUNTER — Ambulatory Visit (HOSPITAL_COMMUNITY)
Admission: RE | Admit: 2015-05-02 | Discharge: 2015-05-02 | Disposition: A | Discharge status: Home / Self Care | Payer: Medicaid Other | Source: Ambulatory Visit | Attending: Obstetrics and Gynecology | Admitting: Obstetrics and Gynecology

## 2015-05-02 ENCOUNTER — Encounter (HOSPITAL_COMMUNITY): Payer: Self-pay

## 2015-05-02 DIAGNOSIS — O99333 Smoking (tobacco) complicating pregnancy, third trimester: Secondary | ICD-10-CM | POA: Diagnosis not present

## 2015-05-02 DIAGNOSIS — Z36 Encounter for antenatal screening of mother: Secondary | ICD-10-CM | POA: Insufficient documentation

## 2015-05-02 DIAGNOSIS — O358XX0 Maternal care for other (suspected) fetal abnormality and damage, not applicable or unspecified: Secondary | ICD-10-CM | POA: Diagnosis present

## 2015-05-02 DIAGNOSIS — Z3A28 28 weeks gestation of pregnancy: Secondary | ICD-10-CM

## 2015-05-02 DIAGNOSIS — Z3A30 30 weeks gestation of pregnancy: Secondary | ICD-10-CM | POA: Diagnosis not present

## 2015-05-02 DIAGNOSIS — F17210 Nicotine dependence, cigarettes, uncomplicated: Secondary | ICD-10-CM

## 2015-05-02 DIAGNOSIS — O289 Unspecified abnormal findings on antenatal screening of mother: Secondary | ICD-10-CM

## 2015-05-05 ENCOUNTER — Encounter: Payer: Medicaid Other | Admitting: Obstetrics & Gynecology

## 2015-05-15 ENCOUNTER — Ambulatory Visit (INDEPENDENT_AMBULATORY_CARE_PROVIDER_SITE_OTHER): Payer: Medicaid Other | Admitting: Family Medicine

## 2015-05-15 VITALS — BP 108/70 | HR 116 | Wt 169.0 lb

## 2015-05-15 DIAGNOSIS — O283 Abnormal ultrasonic finding on antenatal screening of mother: Secondary | ICD-10-CM

## 2015-05-15 DIAGNOSIS — Z3493 Encounter for supervision of normal pregnancy, unspecified, third trimester: Secondary | ICD-10-CM

## 2015-05-15 NOTE — Progress Notes (Signed)
Subjective:   Jasmine Ellis is a 24 y.o. G2P1001 at 4268w0d being seen today for her obstetrical visit.  Patient reports increasing stress and getting very angry with her brother..   Contractions: Contractions: Not present.   Vaginal Bleeding Vag. Bleeding: None.   Fetal Movement: Movement: Present.   The following portions of the patient's history were reviewed and updated as appropriate: allergies, current medications, past family history, past medical history, past social history, past surgical history and problem list.   Objective:  BP 108/70 mmHg  Pulse 116  Wt 169 lb (76.658 kg)  LMP 10/03/2014 Fetal Heart Rate: Fetal Heart Rate (bpm): 165  Fundal Height:  33  Fetal Movement: Movement: Present   Abdomen: Soft, gravid, appropriate for gestational age.  Pain/Pressure: Pain/Pressure: Present     Vaginal: Vaginal Bleeding Vag. Bleeding: None   Discharge: Vag D/C Character: Thin  Extremities: Edema: Edema: None   Urinalysis: Protein: Urine Protein: Negative Glucose: Urine Glucose: Negative   Assessment and Plan:   Pregnancy:  G2P1001 at 6868w0d  1. Supervision of normal pregnancy, third trimester Continue routine care  2. Echogenic bowel of fetus on prenatal ultrasound Resolved on most recent u/s  Preterm labor symptoms: vaginal bleeding, contractions and leaking of fluid reviewed in detail.  Fetal movement precautions reviewed.  Follow up in 2 weeks.   Reva Boresanya S Nene Aranas, MD

## 2015-05-15 NOTE — Patient Instructions (Signed)
Third Trimester of Pregnancy The third trimester is from week 29 through week 42, months 7 through 9. The third trimester is a time when the fetus is growing rapidly. At the end of the ninth month, the fetus is about 20 inches in length and weighs 6-10 pounds.  BODY CHANGES Your body goes through many changes during pregnancy. The changes vary from woman to woman.   Your weight will continue to increase. You can expect to gain 25-35 pounds (11-16 kg) by the end of the pregnancy.  You may begin to get stretch marks on your hips, abdomen, and breasts.  You may urinate more often because the fetus is moving lower into your pelvis and pressing on your bladder.  You may develop or continue to have heartburn as a result of your pregnancy.  You may develop constipation because certain hormones are causing the muscles that push waste through your intestines to slow down.  You may develop hemorrhoids or swollen, bulging veins (varicose veins).  You may have pelvic pain because of the weight gain and pregnancy hormones relaxing your joints between the bones in your pelvis. Backaches may result from overexertion of the muscles supporting your posture.  You may have changes in your hair. These can include thickening of your hair, rapid growth, and changes in texture. Some women also have hair loss during or after pregnancy, or hair that feels dry or thin. Your hair will most likely return to normal after your baby is born.  Your breasts will continue to grow and be tender. A yellow discharge may leak from your breasts called colostrum.  Your belly button may stick out.  You may feel short of breath because of your expanding uterus.  You may notice the fetus "dropping," or moving lower in your abdomen.  You may have a bloody mucus discharge. This usually occurs a few days to a week before labor begins.  Your cervix becomes thin and soft (effaced) near your due date. WHAT TO EXPECT AT YOUR  PRENATAL EXAMS  You will have prenatal exams every 2 weeks until week 36. Then, you will have weekly prenatal exams. During a routine prenatal visit:  You will be weighed to make sure you and the fetus are growing normally.  Your blood pressure is taken.  Your abdomen will be measured to track your baby's growth.  The fetal heartbeat will be listened to.  Any test results from the previous visit will be discussed.  You may have a cervical check near your due date to see if you have effaced. At around 36 weeks, your caregiver will check your cervix. At the same time, your caregiver will also perform a test on the secretions of the vaginal tissue. This test is to determine if a type of bacteria, Group B streptococcus, is present. Your caregiver will explain this further. Your caregiver may ask you:  What your birth plan is.  How you are feeling.  If you are feeling the baby move.  If you have had any abnormal symptoms, such as leaking fluid, bleeding, severe headaches, or abdominal cramping.  If you have any questions. Other tests or screenings that may be performed during your third trimester include:  Blood tests that check for low iron levels (anemia).  Fetal testing to check the health, activity level, and growth of the fetus. Testing is done if you have certain medical conditions or if there are problems during the pregnancy. FALSE LABOR You may feel small, irregular contractions that   eventually go away. These are called Braxton Hicks contractions, or false labor. Contractions may last for hours, days, or even weeks before true labor sets in. If contractions come at regular intervals, intensify, or become painful, it is best to be seen by your caregiver.  SIGNS OF LABOR   Menstrual-like cramps.  Contractions that are 5 minutes apart or less.  Contractions that start on the top of the uterus and spread down to the lower abdomen and back.  A sense of increased pelvic  pressure or back pain.  A watery or bloody mucus discharge that comes from the vagina. If you have any of these signs before the 37th week of pregnancy, call your caregiver right away. You need to go to the hospital to get checked immediately. HOME CARE INSTRUCTIONS   Avoid all smoking, herbs, alcohol, and unprescribed drugs. These chemicals affect the formation and growth of the baby.  Follow your caregiver's instructions regarding medicine use. There are medicines that are either safe or unsafe to take during pregnancy.  Exercise only as directed by your caregiver. Experiencing uterine cramps is a good sign to stop exercising.  Continue to eat regular, healthy meals.  Wear a good support bra for breast tenderness.  Do not use hot tubs, steam rooms, or saunas.  Wear your seat belt at all times when driving.  Avoid raw meat, uncooked cheese, cat litter boxes, and soil used by cats. These carry germs that can cause birth defects in the baby.  Take your prenatal vitamins.  Try taking a stool softener (if your caregiver approves) if you develop constipation. Eat more high-fiber foods, such as fresh vegetables or fruit and whole grains. Drink plenty of fluids to keep your urine clear or pale yellow.  Take warm sitz baths to soothe any pain or discomfort caused by hemorrhoids. Use hemorrhoid cream if your caregiver approves.  If you develop varicose veins, wear support hose. Elevate your feet for 15 minutes, 3-4 times a day. Limit salt in your diet.  Avoid heavy lifting, wear low heal shoes, and practice good posture.  Rest a lot with your legs elevated if you have leg cramps or low back pain.  Visit your dentist if you have not gone during your pregnancy. Use a soft toothbrush to brush your teeth and be gentle when you floss.  A sexual relationship may be continued unless your caregiver directs you otherwise.  Do not travel far distances unless it is absolutely necessary and only  with the approval of your caregiver.  Take prenatal classes to understand, practice, and ask questions about the labor and delivery.  Make a trial run to the hospital.  Pack your hospital bag.  Prepare the baby's nursery.  Continue to go to all your prenatal visits as directed by your caregiver. SEEK MEDICAL CARE IF:  You are unsure if you are in labor or if your water has broken.  You have dizziness.  You have mild pelvic cramps, pelvic pressure, or nagging pain in your abdominal area.  You have persistent nausea, vomiting, or diarrhea.  You have a bad smelling vaginal discharge.  You have pain with urination. SEEK IMMEDIATE MEDICAL CARE IF:   You have a fever.  You are leaking fluid from your vagina.  You have spotting or bleeding from your vagina.  You have severe abdominal cramping or pain.  You have rapid weight loss or gain.  You have shortness of breath with chest pain.  You notice sudden or extreme swelling   of your face, hands, ankles, feet, or legs.  You have not felt your baby move in over an hour.  You have severe headaches that do not go away with medicine.  You have vision changes. Document Released: 11/19/2001 Document Revised: 11/30/2013 Document Reviewed: 01/26/2013 [employer] Patient Information 2015 [employer]. This information is not intended to replace advice given to you by your health care provider. Make sure you discuss any questions you have with your health care provider.  Breastfeeding Deciding to breastfeed is one of the best choices you can make for you and your baby. A change in hormones during pregnancy causes your breast tissue to grow and increases the number and size of your milk ducts. These hormones also allow proteins, sugars, and fats from your blood supply to make breast milk in your milk-producing glands. Hormones prevent breast milk from being released before your baby is born as well as prompt milk flow after birth. Once  breastfeeding has begun, thoughts of your baby, as well as his or her sucking or crying, can stimulate the release of milk from your milk-producing glands.  BENEFITS OF BREASTFEEDING For Your Baby  Your first milk (colostrum) helps your baby's digestive system function better.   There are antibodies in your milk that help your baby fight off infections.   Your baby has a lower incidence of asthma, allergies, and sudden infant death syndrome.   The nutrients in breast milk are better for your baby than infant formulas and are designed uniquely for your baby's needs.   Breast milk improves your baby's brain development.   Your baby is less likely to develop other conditions, such as childhood obesity, asthma, or type 2 diabetes mellitus.  For You   Breastfeeding helps to create a very special bond between you and your baby.   Breastfeeding is convenient. Breast milk is always available at the correct temperature and costs nothing.   Breastfeeding helps to burn calories and helps you lose the weight gained during pregnancy.   Breastfeeding makes your uterus contract to its prepregnancy size faster and slows bleeding (lochia) after you give birth.   Breastfeeding helps to lower your risk of developing type 2 diabetes mellitus, osteoporosis, and breast or ovarian cancer later in life. SIGNS THAT YOUR BABY IS HUNGRY Early Signs of Hunger  Increased alertness or activity.  Stretching.  Movement of the head from side to side.  Movement of the head and opening of the mouth when the corner of the mouth or cheek is stroked (rooting).  Increased sucking sounds, smacking lips, cooing, sighing, or squeaking.  Hand-to-mouth movements.  Increased sucking of fingers or hands. Late Signs of Hunger  Fussing.  Intermittent crying. Extreme Signs of Hunger Signs of extreme hunger will require calming and consoling before your baby will be able to breastfeed successfully. Do not  wait for the following signs of extreme hunger to occur before you initiate breastfeeding:   Restlessness.  A loud, strong cry.   Screaming. BREASTFEEDING BASICS Breastfeeding Initiation  Find a comfortable place to sit or lie down, with your neck and back well supported.  Place a pillow or rolled up blanket under your baby to bring him or her to the level of your breast (if you are seated). Nursing pillows are specially designed to help support your arms and your baby while you breastfeed.  Make sure that your baby's abdomen is facing your abdomen.   Gently massage your breast. With your fingertips, massage from your chest   wall toward your nipple in a circular motion. This encourages milk flow. You may need to continue this action during the feeding if your milk flows slowly.  Support your breast with 4 fingers underneath and your thumb above your nipple. Make sure your fingers are well away from your nipple and your baby's mouth.   Stroke your baby's lips gently with your finger or nipple.   When your baby's mouth is open wide enough, quickly bring your baby to your breast, placing your entire nipple and as much of the colored area around your nipple (areola) as possible into your baby's mouth.   More areola should be visible above your baby's upper lip than below the lower lip.   Your baby's tongue should be between his or her lower gum and your breast.   Ensure that your baby's mouth is correctly positioned around your nipple (latched). Your baby's lips should create a seal on your breast and be turned out (everted).  It is common for your baby to suck about 2-3 minutes in order to start the flow of breast milk. Latching Teaching your baby how to latch on to your breast properly is very important. An improper latch can cause nipple pain and decreased milk supply for you and poor weight gain in your baby. Also, if your baby is not latched onto your nipple properly, he or she  may swallow some air during feeding. This can make your baby fussy. Burping your baby when you switch breasts during the feeding can help to get rid of the air. However, teaching your baby to latch on properly is still the best way to prevent fussiness from swallowing air while breastfeeding. Signs that your baby has successfully latched on to your nipple:    Silent tugging or silent sucking, without causing you pain.   Swallowing heard between every 3-4 sucks.    Muscle movement above and in front of his or her ears while sucking.  Signs that your baby has not successfully latched on to nipple:   Sucking sounds or smacking sounds from your baby while breastfeeding.  Nipple pain. If you think your baby has not latched on correctly, slip your finger into the corner of your baby's mouth to break the suction and place it between your baby's gums. Attempt breastfeeding initiation again. Signs of Successful Breastfeeding Signs from your baby:   A gradual decrease in the number of sucks or complete cessation of sucking.   Falling asleep.   Relaxation of his or her body.   Retention of a small amount of milk in his or her mouth.   Letting go of your breast by himself or herself. Signs from you:  Breasts that have increased in firmness, weight, and size 1-3 hours after feeding.   Breasts that are softer immediately after breastfeeding.  Increased milk volume, as well as a change in milk consistency and color by the fifth day of breastfeeding.   Nipples that are not sore, cracked, or bleeding. Signs That Your Baby is Getting Enough Milk  Wetting at least 3 diapers in a 24-hour period. The urine should be clear and pale yellow by age 5 days.  At least 3 stools in a 24-hour period by age 5 days. The stool should be soft and yellow.  At least 3 stools in a 24-hour period by age 7 days. The stool should be seedy and yellow.  No loss of weight greater than 10% of birth weight  during the first 3   days of age.  Average weight gain of 4-7 ounces (113-198 g) per week after age 4 days.  Consistent daily weight gain by age 5 days, without weight loss after the age of 2 weeks. After a feeding, your baby may spit up a small amount. This is common. BREASTFEEDING FREQUENCY AND DURATION Frequent feeding will help you make more milk and can prevent sore nipples and breast engorgement. Breastfeed when you feel the need to reduce the fullness of your breasts or when your baby shows signs of hunger. This is called "breastfeeding on demand." Avoid introducing a pacifier to your baby while you are working to establish breastfeeding (the first 4-6 weeks after your baby is born). After this time you may choose to use a pacifier. Research has shown that pacifier use during the first year of a baby's life decreases the risk of sudden infant death syndrome (SIDS). Allow your baby to feed on each breast as long as he or she wants. Breastfeed until your baby is finished feeding. When your baby unlatches or falls asleep while feeding from the first breast, offer the second breast. Because newborns are often sleepy in the first few weeks of life, you may need to awaken your baby to get him or her to feed. Breastfeeding times will vary from baby to baby. However, the following rules can serve as a guide to help you ensure that your baby is properly fed:  Newborns (babies 4 weeks of age or younger) may breastfeed every 1-3 hours.  Newborns should not go longer than 3 hours during the day or 5 hours during the night without breastfeeding.  You should breastfeed your baby a minimum of 8 times in a 24-hour period until you begin to introduce solid foods to your baby at around 6 months of age. BREAST MILK PUMPING Pumping and storing breast milk allows you to ensure that your baby is exclusively fed your breast milk, even at times when you are unable to breastfeed. This is especially important if you are  going back to work while you are still breastfeeding or when you are not able to be present during feedings. Your lactation consultant can give you guidelines on how long it is safe to store breast milk.  A breast pump is a machine that allows you to pump milk from your breast into a sterile bottle. The pumped breast milk can then be stored in a refrigerator or freezer. Some breast pumps are operated by hand, while others use electricity. Ask your lactation consultant which type will work best for you. Breast pumps can be purchased, but some hospitals and breastfeeding support groups lease breast pumps on a monthly basis. A lactation consultant can teach you how to hand express breast milk, if you prefer not to use a pump.  CARING FOR YOUR BREASTS WHILE YOU BREASTFEED Nipples can become dry, cracked, and sore while breastfeeding. The following recommendations can help keep your breasts moisturized and healthy:  Avoid using soap on your nipples.   Wear a supportive bra. Although not required, special nursing bras and tank tops are designed to allow access to your breasts for breastfeeding without taking off your entire bra or top. Avoid wearing underwire-style bras or extremely tight bras.  Air dry your nipples for 3-4minutes after each feeding.   Use only cotton bra pads to absorb leaked breast milk. Leaking of breast milk between feedings is normal.   Use lanolin on your nipples after breastfeeding. Lanolin helps to maintain your skin's   normal moisture barrier. If you use pure lanolin, you do not need to wash it off before feeding your baby again. Pure lanolin is not toxic to your baby. You may also hand express a few drops of breast milk and gently massage that milk into your nipples and allow the milk to air dry. In the first few weeks after giving birth, some women experience extremely full breasts (engorgement). Engorgement can make your breasts feel heavy, warm, and tender to the touch.  Engorgement peaks within 3-5 days after you give birth. The following recommendations can help ease engorgement:  Completely empty your breasts while breastfeeding or pumping. You may want to start by applying warm, moist heat (in the shower or with warm water-soaked hand towels) just before feeding or pumping. This increases circulation and helps the milk flow. If your baby does not completely empty your breasts while breastfeeding, pump any extra milk after he or she is finished.  Wear a snug bra (nursing or regular) or tank top for 1-2 days to signal your body to slightly decrease milk production.  Apply ice packs to your breasts, unless this is too uncomfortable for you.  Make sure that your baby is latched on and positioned properly while breastfeeding. If engorgement persists after 48 hours of following these recommendations, contact your health care provider or a lactation consultant. OVERALL HEALTH CARE RECOMMENDATIONS WHILE BREASTFEEDING  Eat healthy foods. Alternate between meals and snacks, eating 3 of each per day. Because what you eat affects your breast milk, some of the foods may make your baby more irritable than usual. Avoid eating these foods if you are sure that they are negatively affecting your baby.  Drink milk, fruit juice, and water to satisfy your thirst (about 10 glasses a day).   Rest often, relax, and continue to take your prenatal vitamins to prevent fatigue, stress, and anemia.  Continue breast self-awareness checks.  Avoid chewing and smoking tobacco.  Avoid alcohol and drug use. Some medicines that may be harmful to your baby can pass through breast milk. It is important to ask your health care provider before taking any medicine, including all over-the-counter and prescription medicine as well as vitamin and herbal supplements. It is possible to become pregnant while breastfeeding. If birth control is desired, ask your health care provider about options that  will be safe for your baby. SEEK MEDICAL CARE IF:   You feel like you want to stop breastfeeding or have become frustrated with breastfeeding.  You have painful breasts or nipples.  Your nipples are cracked or bleeding.  Your breasts are red, tender, or warm.  You have a swollen area on either breast.  You have a fever or chills.  You have nausea or vomiting.  You have drainage other than breast milk from your nipples.  Your breasts do not become full before feedings by the fifth day after you give birth.  You feel sad and depressed.  Your baby is too sleepy to eat well.  Your baby is having trouble sleeping.   Your baby is wetting less than 3 diapers in a 24-hour period.  Your baby has less than 3 stools in a 24-hour period.  Your baby's skin or the white part of his or her eyes becomes yellow.   Your baby is not gaining weight by 5 days of age. SEEK IMMEDIATE MEDICAL CARE IF:   Your baby is overly tired (lethargic) and does not want to wake up and feed.  Your baby   develops an unexplained fever. Document Released: 11/25/2005 Document Revised: 11/30/2013 Document Reviewed: 05/19/2013 [employer] Patient Information 2015 [employer]. This information is not intended to replace advice given to you by your health care provider. Make sure you discuss any questions you have with your health care provider.  

## 2015-05-23 ENCOUNTER — Ambulatory Visit (INDEPENDENT_AMBULATORY_CARE_PROVIDER_SITE_OTHER): Payer: Self-pay | Admitting: Family Medicine

## 2015-05-23 ENCOUNTER — Encounter: Payer: Self-pay | Admitting: Family Medicine

## 2015-05-23 VITALS — BP 107/80 | HR 83 | Wt 172.0 lb

## 2015-05-23 DIAGNOSIS — Z3493 Encounter for supervision of normal pregnancy, unspecified, third trimester: Secondary | ICD-10-CM

## 2015-05-23 NOTE — Progress Notes (Signed)
Subjective:  Jasmine Ellis is a 24 y.o. G2P1001 at [redacted]w[redacted]d being seen today for ongoing prenatal care.  Patient reports no complaints.  Contractions: Not present.  Vag. Bleeding: None. Movement: Present. Denies leaking of fluid.   The following portions of the patient's history were reviewed and updated as appropriate: allergies, current medications, past family history, past medical history, past social history, past surgical history and problem list.   Objective:   Filed Vitals:   05/23/15 0943  BP: 107/80  Pulse: 83  Weight: 172 lb (78.019 kg)    Fetal Status: Fetal Heart Rate (bpm): 143 Fundal Height: 33 cm Movement: Present  Presentation: Vertex  General:  Alert, oriented and cooperative. Patient is in no acute distress.  Skin: Skin is warm and dry. No rash noted.   Cardiovascular: Normal heart rate noted  Respiratory: Effort and breath sounds normal, no problems with respiration noted  Abdomen: Soft, gravid, appropriate for gestational age. Pain/Pressure: Absent     Vaginal: Vag. Bleeding: None.    Vag D/C Character: Thin  Cervix: Not evaluated  Extremities: Normal range of motion.  Edema: None  Mental Status: Normal mood and affect. Normal behavior. Normal judgment and thought content.   Urinalysis: Urine Protein: Negative Urine Glucose: Negative  Assessment and Plan:  Pregnancy: G2P1001 at [redacted]w[redacted]d Supervision of normal pregnancy, third trimester   Preterm labor symptoms and general obstetric precautions including but not limited to vaginal bleeding, contractions, leaking of fluid and fetal movement were reviewed in detail with the patient.  Please refer to After Visit Summary for other counseling recommendations.   Return in 2 weeks (on 06/06/2015).   Reva Bores, MD

## 2015-05-23 NOTE — Patient Instructions (Signed)
Third Trimester of Pregnancy The third trimester is from week 29 through week 42, months 7 through 9. The third trimester is a time when the fetus is growing rapidly. At the end of the ninth month, the fetus is about 20 inches in length and weighs 6-10 pounds.  BODY CHANGES Your body goes through many changes during pregnancy. The changes vary from woman to woman.   Your weight will continue to increase. You can expect to gain 25-35 pounds (11-16 kg) by the end of the pregnancy.  You may begin to get stretch marks on your hips, abdomen, and breasts.  You may urinate more often because the fetus is moving lower into your pelvis and pressing on your bladder.  You may develop or continue to have heartburn as a result of your pregnancy.  You may develop constipation because certain hormones are causing the muscles that push waste through your intestines to slow down.  You may develop hemorrhoids or swollen, bulging veins (varicose veins).  You may have pelvic pain because of the weight gain and pregnancy hormones relaxing your joints between the bones in your pelvis. Backaches may result from overexertion of the muscles supporting your posture.  You may have changes in your hair. These can include thickening of your hair, rapid growth, and changes in texture. Some women also have hair loss during or after pregnancy, or hair that feels dry or thin. Your hair will most likely return to normal after your baby is born.  Your breasts will continue to grow and be tender. A yellow discharge may leak from your breasts called colostrum.  Your belly button may stick out.  You may feel short of breath because of your expanding uterus.  You may notice the fetus "dropping," or moving lower in your abdomen.  You may have a bloody mucus discharge. This usually occurs a few days to a week before labor begins.  Your cervix becomes thin and soft (effaced) near your due date. WHAT TO EXPECT AT YOUR  PRENATAL EXAMS  You will have prenatal exams every 2 weeks until week 36. Then, you will have weekly prenatal exams. During a routine prenatal visit:  You will be weighed to make sure you and the fetus are growing normally.  Your blood pressure is taken.  Your abdomen will be measured to track your baby's growth.  The fetal heartbeat will be listened to.  Any test results from the previous visit will be discussed.  You may have a cervical check near your due date to see if you have effaced. At around 36 weeks, your caregiver will check your cervix. At the same time, your caregiver will also perform a test on the secretions of the vaginal tissue. This test is to determine if a type of bacteria, Group B streptococcus, is present. Your caregiver will explain this further. Your caregiver may ask you:  What your birth plan is.  How you are feeling.  If you are feeling the baby move.  If you have had any abnormal symptoms, such as leaking fluid, bleeding, severe headaches, or abdominal cramping.  If you have any questions. Other tests or screenings that may be performed during your third trimester include:  Blood tests that check for low iron levels (anemia).  Fetal testing to check the health, activity level, and growth of the fetus. Testing is done if you have certain medical conditions or if there are problems during the pregnancy. FALSE LABOR You may feel small, irregular contractions that   eventually go away. These are called Braxton Hicks contractions, or false labor. Contractions may last for hours, days, or even weeks before true labor sets in. If contractions come at regular intervals, intensify, or become painful, it is best to be seen by your caregiver.  SIGNS OF LABOR   Menstrual-like cramps.  Contractions that are 5 minutes apart or less.  Contractions that start on the top of the uterus and spread down to the lower abdomen and back.  A sense of increased pelvic  pressure or back pain.  A watery or bloody mucus discharge that comes from the vagina. If you have any of these signs before the 37th week of pregnancy, call your caregiver right away. You need to go to the hospital to get checked immediately. HOME CARE INSTRUCTIONS   Avoid all smoking, herbs, alcohol, and unprescribed drugs. These chemicals affect the formation and growth of the baby.  Follow your caregiver's instructions regarding medicine use. There are medicines that are either safe or unsafe to take during pregnancy.  Exercise only as directed by your caregiver. Experiencing uterine cramps is a good sign to stop exercising.  Continue to eat regular, healthy meals.  Wear a good support bra for breast tenderness.  Do not use hot tubs, steam rooms, or saunas.  Wear your seat belt at all times when driving.  Avoid raw meat, uncooked cheese, cat litter boxes, and soil used by cats. These carry germs that can cause birth defects in the baby.  Take your prenatal vitamins.  Try taking a stool softener (if your caregiver approves) if you develop constipation. Eat more high-fiber foods, such as fresh vegetables or fruit and whole grains. Drink plenty of fluids to keep your urine clear or pale yellow.  Take warm sitz baths to soothe any pain or discomfort caused by hemorrhoids. Use hemorrhoid cream if your caregiver approves.  If you develop varicose veins, wear support hose. Elevate your feet for 15 minutes, 3-4 times a day. Limit salt in your diet.  Avoid heavy lifting, wear low heal shoes, and practice good posture.  Rest a lot with your legs elevated if you have leg cramps or low back pain.  Visit your dentist if you have not gone during your pregnancy. Use a soft toothbrush to brush your teeth and be gentle when you floss.  A sexual relationship may be continued unless your caregiver directs you otherwise.  Do not travel far distances unless it is absolutely necessary and only  with the approval of your caregiver.  Take prenatal classes to understand, practice, and ask questions about the labor and delivery.  Make a trial run to the hospital.  Pack your hospital bag.  Prepare the baby's nursery.  Continue to go to all your prenatal visits as directed by your caregiver. SEEK MEDICAL CARE IF:  You are unsure if you are in labor or if your water has broken.  You have dizziness.  You have mild pelvic cramps, pelvic pressure, or nagging pain in your abdominal area.  You have persistent nausea, vomiting, or diarrhea.  You have a bad smelling vaginal discharge.  You have pain with urination. SEEK IMMEDIATE MEDICAL CARE IF:   You have a fever.  You are leaking fluid from your vagina.  You have spotting or bleeding from your vagina.  You have severe abdominal cramping or pain.  You have rapid weight loss or gain.  You have shortness of breath with chest pain.  You notice sudden or extreme swelling   of your face, hands, ankles, feet, or legs.  You have not felt your baby move in over an hour.  You have severe headaches that do not go away with medicine.  You have vision changes. Document Released: 11/19/2001 Document Revised: 11/30/2013 Document Reviewed: 01/26/2013 [employer] Patient Information 2015 [employer]. This information is not intended to replace advice given to you by your health care provider. Make sure you discuss any questions you have with your health care provider.  Breastfeeding Deciding to breastfeed is one of the best choices you can make for you and your baby. A change in hormones during pregnancy causes your breast tissue to grow and increases the number and size of your milk ducts. These hormones also allow proteins, sugars, and fats from your blood supply to make breast milk in your milk-producing glands. Hormones prevent breast milk from being released before your baby is born as well as prompt milk flow after birth. Once  breastfeeding has begun, thoughts of your baby, as well as his or her sucking or crying, can stimulate the release of milk from your milk-producing glands.  BENEFITS OF BREASTFEEDING For Your Baby  Your first milk (colostrum) helps your baby's digestive system function better.   There are antibodies in your milk that help your baby fight off infections.   Your baby has a lower incidence of asthma, allergies, and sudden infant death syndrome.   The nutrients in breast milk are better for your baby than infant formulas and are designed uniquely for your baby's needs.   Breast milk improves your baby's brain development.   Your baby is less likely to develop other conditions, such as childhood obesity, asthma, or type 2 diabetes mellitus.  For You   Breastfeeding helps to create a very special bond between you and your baby.   Breastfeeding is convenient. Breast milk is always available at the correct temperature and costs nothing.   Breastfeeding helps to burn calories and helps you lose the weight gained during pregnancy.   Breastfeeding makes your uterus contract to its prepregnancy size faster and slows bleeding (lochia) after you give birth.   Breastfeeding helps to lower your risk of developing type 2 diabetes mellitus, osteoporosis, and breast or ovarian cancer later in life. SIGNS THAT YOUR BABY IS HUNGRY Early Signs of Hunger  Increased alertness or activity.  Stretching.  Movement of the head from side to side.  Movement of the head and opening of the mouth when the corner of the mouth or cheek is stroked (rooting).  Increased sucking sounds, smacking lips, cooing, sighing, or squeaking.  Hand-to-mouth movements.  Increased sucking of fingers or hands. Late Signs of Hunger  Fussing.  Intermittent crying. Extreme Signs of Hunger Signs of extreme hunger will require calming and consoling before your baby will be able to breastfeed successfully. Do not  wait for the following signs of extreme hunger to occur before you initiate breastfeeding:   Restlessness.  A loud, strong cry.   Screaming. BREASTFEEDING BASICS Breastfeeding Initiation  Find a comfortable place to sit or lie down, with your neck and back well supported.  Place a pillow or rolled up blanket under your baby to bring him or her to the level of your breast (if you are seated). Nursing pillows are specially designed to help support your arms and your baby while you breastfeed.  Make sure that your baby's abdomen is facing your abdomen.   Gently massage your breast. With your fingertips, massage from your chest   wall toward your nipple in a circular motion. This encourages milk flow. You may need to continue this action during the feeding if your milk flows slowly.  Support your breast with 4 fingers underneath and your thumb above your nipple. Make sure your fingers are well away from your nipple and your baby's mouth.   Stroke your baby's lips gently with your finger or nipple.   When your baby's mouth is open wide enough, quickly bring your baby to your breast, placing your entire nipple and as much of the colored area around your nipple (areola) as possible into your baby's mouth.   More areola should be visible above your baby's upper lip than below the lower lip.   Your baby's tongue should be between his or her lower gum and your breast.   Ensure that your baby's mouth is correctly positioned around your nipple (latched). Your baby's lips should create a seal on your breast and be turned out (everted).  It is common for your baby to suck about 2-3 minutes in order to start the flow of breast milk. Latching Teaching your baby how to latch on to your breast properly is very important. An improper latch can cause nipple pain and decreased milk supply for you and poor weight gain in your baby. Also, if your baby is not latched onto your nipple properly, he or she  may swallow some air during feeding. This can make your baby fussy. Burping your baby when you switch breasts during the feeding can help to get rid of the air. However, teaching your baby to latch on properly is still the best way to prevent fussiness from swallowing air while breastfeeding. Signs that your baby has successfully latched on to your nipple:    Silent tugging or silent sucking, without causing you pain.   Swallowing heard between every 3-4 sucks.    Muscle movement above and in front of his or her ears while sucking.  Signs that your baby has not successfully latched on to nipple:   Sucking sounds or smacking sounds from your baby while breastfeeding.  Nipple pain. If you think your baby has not latched on correctly, slip your finger into the corner of your baby's mouth to break the suction and place it between your baby's gums. Attempt breastfeeding initiation again. Signs of Successful Breastfeeding Signs from your baby:   A gradual decrease in the number of sucks or complete cessation of sucking.   Falling asleep.   Relaxation of his or her body.   Retention of a small amount of milk in his or her mouth.   Letting go of your breast by himself or herself. Signs from you:  Breasts that have increased in firmness, weight, and size 1-3 hours after feeding.   Breasts that are softer immediately after breastfeeding.  Increased milk volume, as well as a change in milk consistency and color by the fifth day of breastfeeding.   Nipples that are not sore, cracked, or bleeding. Signs That Your Baby is Getting Enough Milk  Wetting at least 3 diapers in a 24-hour period. The urine should be clear and pale yellow by age 5 days.  At least 3 stools in a 24-hour period by age 5 days. The stool should be soft and yellow.  At least 3 stools in a 24-hour period by age 7 days. The stool should be seedy and yellow.  No loss of weight greater than 10% of birth weight  during the first 3   days of age.  Average weight gain of 4-7 ounces (113-198 g) per week after age 4 days.  Consistent daily weight gain by age 5 days, without weight loss after the age of 2 weeks. After a feeding, your baby may spit up a small amount. This is common. BREASTFEEDING FREQUENCY AND DURATION Frequent feeding will help you make more milk and can prevent sore nipples and breast engorgement. Breastfeed when you feel the need to reduce the fullness of your breasts or when your baby shows signs of hunger. This is called "breastfeeding on demand." Avoid introducing a pacifier to your baby while you are working to establish breastfeeding (the first 4-6 weeks after your baby is born). After this time you may choose to use a pacifier. Research has shown that pacifier use during the first year of a baby's life decreases the risk of sudden infant death syndrome (SIDS). Allow your baby to feed on each breast as long as he or she wants. Breastfeed until your baby is finished feeding. When your baby unlatches or falls asleep while feeding from the first breast, offer the second breast. Because newborns are often sleepy in the first few weeks of life, you may need to awaken your baby to get him or her to feed. Breastfeeding times will vary from baby to baby. However, the following rules can serve as a guide to help you ensure that your baby is properly fed:  Newborns (babies 4 weeks of age or younger) may breastfeed every 1-3 hours.  Newborns should not go longer than 3 hours during the day or 5 hours during the night without breastfeeding.  You should breastfeed your baby a minimum of 8 times in a 24-hour period until you begin to introduce solid foods to your baby at around 6 months of age. BREAST MILK PUMPING Pumping and storing breast milk allows you to ensure that your baby is exclusively fed your breast milk, even at times when you are unable to breastfeed. This is especially important if you are  going back to work while you are still breastfeeding or when you are not able to be present during feedings. Your lactation consultant can give you guidelines on how long it is safe to store breast milk.  A breast pump is a machine that allows you to pump milk from your breast into a sterile bottle. The pumped breast milk can then be stored in a refrigerator or freezer. Some breast pumps are operated by hand, while others use electricity. Ask your lactation consultant which type will work best for you. Breast pumps can be purchased, but some hospitals and breastfeeding support groups lease breast pumps on a monthly basis. A lactation consultant can teach you how to hand express breast milk, if you prefer not to use a pump.  CARING FOR YOUR BREASTS WHILE YOU BREASTFEED Nipples can become dry, cracked, and sore while breastfeeding. The following recommendations can help keep your breasts moisturized and healthy:  Avoid using soap on your nipples.   Wear a supportive bra. Although not required, special nursing bras and tank tops are designed to allow access to your breasts for breastfeeding without taking off your entire bra or top. Avoid wearing underwire-style bras or extremely tight bras.  Air dry your nipples for 3-4minutes after each feeding.   Use only cotton bra pads to absorb leaked breast milk. Leaking of breast milk between feedings is normal.   Use lanolin on your nipples after breastfeeding. Lanolin helps to maintain your skin's   normal moisture barrier. If you use pure lanolin, you do not need to wash it off before feeding your baby again. Pure lanolin is not toxic to your baby. You may also hand express a few drops of breast milk and gently massage that milk into your nipples and allow the milk to air dry. In the first few weeks after giving birth, some women experience extremely full breasts (engorgement). Engorgement can make your breasts feel heavy, warm, and tender to the touch.  Engorgement peaks within 3-5 days after you give birth. The following recommendations can help ease engorgement:  Completely empty your breasts while breastfeeding or pumping. You may want to start by applying warm, moist heat (in the shower or with warm water-soaked hand towels) just before feeding or pumping. This increases circulation and helps the milk flow. If your baby does not completely empty your breasts while breastfeeding, pump any extra milk after he or she is finished.  Wear a snug bra (nursing or regular) or tank top for 1-2 days to signal your body to slightly decrease milk production.  Apply ice packs to your breasts, unless this is too uncomfortable for you.  Make sure that your baby is latched on and positioned properly while breastfeeding. If engorgement persists after 48 hours of following these recommendations, contact your health care provider or a lactation consultant. OVERALL HEALTH CARE RECOMMENDATIONS WHILE BREASTFEEDING  Eat healthy foods. Alternate between meals and snacks, eating 3 of each per day. Because what you eat affects your breast milk, some of the foods may make your baby more irritable than usual. Avoid eating these foods if you are sure that they are negatively affecting your baby.  Drink milk, fruit juice, and water to satisfy your thirst (about 10 glasses a day).   Rest often, relax, and continue to take your prenatal vitamins to prevent fatigue, stress, and anemia.  Continue breast self-awareness checks.  Avoid chewing and smoking tobacco.  Avoid alcohol and drug use. Some medicines that may be harmful to your baby can pass through breast milk. It is important to ask your health care provider before taking any medicine, including all over-the-counter and prescription medicine as well as vitamin and herbal supplements. It is possible to become pregnant while breastfeeding. If birth control is desired, ask your health care provider about options that  will be safe for your baby. SEEK MEDICAL CARE IF:   You feel like you want to stop breastfeeding or have become frustrated with breastfeeding.  You have painful breasts or nipples.  Your nipples are cracked or bleeding.  Your breasts are red, tender, or warm.  You have a swollen area on either breast.  You have a fever or chills.  You have nausea or vomiting.  You have drainage other than breast milk from your nipples.  Your breasts do not become full before feedings by the fifth day after you give birth.  You feel sad and depressed.  Your baby is too sleepy to eat well.  Your baby is having trouble sleeping.   Your baby is wetting less than 3 diapers in a 24-hour period.  Your baby has less than 3 stools in a 24-hour period.  Your baby's skin or the white part of his or her eyes becomes yellow.   Your baby is not gaining weight by 5 days of age. SEEK IMMEDIATE MEDICAL CARE IF:   Your baby is overly tired (lethargic) and does not want to wake up and feed.  Your baby   develops an unexplained fever. Document Released: 11/25/2005 Document Revised: 11/30/2013 Document Reviewed: 05/19/2013 [employer] Patient Information 2015 [employer]. This information is not intended to replace advice given to you by your health care provider. Make sure you discuss any questions you have with your health care provider.  

## 2015-06-06 ENCOUNTER — Ambulatory Visit (INDEPENDENT_AMBULATORY_CARE_PROVIDER_SITE_OTHER): Payer: Self-pay | Admitting: Family Medicine

## 2015-06-06 VITALS — BP 114/76 | HR 116 | Wt 177.0 lb

## 2015-06-06 DIAGNOSIS — O99330 Smoking (tobacco) complicating pregnancy, unspecified trimester: Secondary | ICD-10-CM

## 2015-06-06 DIAGNOSIS — Z3483 Encounter for supervision of other normal pregnancy, third trimester: Secondary | ICD-10-CM

## 2015-06-06 DIAGNOSIS — O99350 Diseases of the nervous system complicating pregnancy, unspecified trimester: Secondary | ICD-10-CM

## 2015-06-06 DIAGNOSIS — G40909 Epilepsy, unspecified, not intractable, without status epilepticus: Secondary | ICD-10-CM

## 2015-06-06 DIAGNOSIS — Z3493 Encounter for supervision of normal pregnancy, unspecified, third trimester: Secondary | ICD-10-CM

## 2015-06-06 NOTE — Progress Notes (Signed)
Subjective:  Jasmine Ellis is a 24 y.o. G2P1001 at 6060w1d being seen today for ongoing prenatal care.  Patient reports no complaints. A few BH contractions  Contractions: Irregular.  Vag. Bleeding: None. Movement: Present. Denies leaking of fluid.   The following portions of the patient's history were reviewed and updated as appropriate: allergies, current medications, past family history, past medical history, past social history, past surgical history and problem list.   Objective:   Filed Vitals:   06/06/15 0958  BP: 114/76  Pulse: 116  Weight: 177 lb (80.287 kg)    Fetal Status: Fetal Heart Rate (bpm): 142 Fundal Height: 34 cm Movement: Present  Presentation: Vertex  General:  Alert, oriented and cooperative. Patient is in no acute distress.  Skin: Skin is warm and dry. No rash noted.   Cardiovascular: Normal heart rate noted  Respiratory: Normal respiratory effort, no problems with respiration noted  Abdomen: Soft, gravid, appropriate for gestational age. Pain/Pressure: Present     Vaginal: Vag. Bleeding: None.    Vag D/C Character: Yellow  Extremities: Normal range of motion.  Edema: Trace  Mental Status: Normal mood and affect. Normal behavior. Normal judgment and thought content.   Urinalysis: Urine Protein: Negative Urine Glucose: Negative  Assessment and Plan:  Pregnancy: G2P1001 at 3260w1d  1. Supervision of normal pregnancy, third trimester Continue routine care-will need cultures next week.  2. Tobacco smoking affecting pregnancy   3. Epilepsy affecting pregnancy, antepartum No seizures   Term labor symptoms and general obstetric precautions including but not limited to vaginal bleeding, contractions, leaking of fluid and fetal movement were reviewed in detail with the patient.  Please refer to After Visit Summary for other counseling recommendations.   Return in 1 week (on 06/13/2015).   Reva Boresanya S Pratt, MD

## 2015-06-06 NOTE — Patient Instructions (Signed)
Third Trimester of Pregnancy The third trimester is from week 29 through week 42, months 7 through 9. The third trimester is a time when the fetus is growing rapidly. At the end of the ninth month, the fetus is about 20 inches in length and weighs 6-10 pounds.  BODY CHANGES Your body goes through many changes during pregnancy. The changes vary from woman to woman.   Your weight will continue to increase. You can expect to gain 25-35 pounds (11-16 kg) by the end of the pregnancy.  You may begin to get stretch marks on your hips, abdomen, and breasts.  You may urinate more often because the fetus is moving lower into your pelvis and pressing on your bladder.  You may develop or continue to have heartburn as a result of your pregnancy.  You may develop constipation because certain hormones are causing the muscles that push waste through your intestines to slow down.  You may develop hemorrhoids or swollen, bulging veins (varicose veins).  You may have pelvic pain because of the weight gain and pregnancy hormones relaxing your joints between the bones in your pelvis. Backaches may result from overexertion of the muscles supporting your posture.  You may have changes in your hair. These can include thickening of your hair, rapid growth, and changes in texture. Some women also have hair loss during or after pregnancy, or hair that feels dry or thin. Your hair will most likely return to normal after your baby is born.  Your breasts will continue to grow and be tender. A yellow discharge may leak from your breasts called colostrum.  Your belly button may stick out.  You may feel short of breath because of your expanding uterus.  You may notice the fetus "dropping," or moving lower in your abdomen.  You may have a bloody mucus discharge. This usually occurs a few days to a week before labor begins.  Your cervix becomes thin and soft (effaced) near your due date. WHAT TO EXPECT AT YOUR  PRENATAL EXAMS  You will have prenatal exams every 2 weeks until week 36. Then, you will have weekly prenatal exams. During a routine prenatal visit:  You will be weighed to make sure you and the fetus are growing normally.  Your blood pressure is taken.  Your abdomen will be measured to track your baby's growth.  The fetal heartbeat will be listened to.  Any test results from the previous visit will be discussed.  You may have a cervical check near your due date to see if you have effaced. At around 36 weeks, your caregiver will check your cervix. At the same time, your caregiver will also perform a test on the secretions of the vaginal tissue. This test is to determine if a type of bacteria, Group B streptococcus, is present. Your caregiver will explain this further. Your caregiver may ask you:  What your birth plan is.  How you are feeling.  If you are feeling the baby move.  If you have had any abnormal symptoms, such as leaking fluid, bleeding, severe headaches, or abdominal cramping.  If you have any questions. Other tests or screenings that may be performed during your third trimester include:  Blood tests that check for low iron levels (anemia).  Fetal testing to check the health, activity level, and growth of the fetus. Testing is done if you have certain medical conditions or if there are problems during the pregnancy. FALSE LABOR You may feel small, irregular contractions that   eventually go away. These are called Braxton Hicks contractions, or false labor. Contractions may last for hours, days, or even weeks before true labor sets in. If contractions come at regular intervals, intensify, or become painful, it is best to be seen by your caregiver.  SIGNS OF LABOR   Menstrual-like cramps.  Contractions that are 5 minutes apart or less.  Contractions that start on the top of the uterus and spread down to the lower abdomen and back.  A sense of increased pelvic  pressure or back pain.  A watery or bloody mucus discharge that comes from the vagina. If you have any of these signs before the 37th week of pregnancy, call your caregiver right away. You need to go to the hospital to get checked immediately. HOME CARE INSTRUCTIONS   Avoid all smoking, herbs, alcohol, and unprescribed drugs. These chemicals affect the formation and growth of the baby.  Follow your caregiver's instructions regarding medicine use. There are medicines that are either safe or unsafe to take during pregnancy.  Exercise only as directed by your caregiver. Experiencing uterine cramps is a good sign to stop exercising.  Continue to eat regular, healthy meals.  Wear a good support bra for breast tenderness.  Do not use hot tubs, steam rooms, or saunas.  Wear your seat belt at all times when driving.  Avoid raw meat, uncooked cheese, cat litter boxes, and soil used by cats. These carry germs that can cause birth defects in the baby.  Take your prenatal vitamins.  Try taking a stool softener (if your caregiver approves) if you develop constipation. Eat more high-fiber foods, such as fresh vegetables or fruit and whole grains. Drink plenty of fluids to keep your urine clear or pale yellow.  Take warm sitz baths to soothe any pain or discomfort caused by hemorrhoids. Use hemorrhoid cream if your caregiver approves.  If you develop varicose veins, wear support hose. Elevate your feet for 15 minutes, 3-4 times a day. Limit salt in your diet.  Avoid heavy lifting, wear low heal shoes, and practice good posture.  Rest a lot with your legs elevated if you have leg cramps or low back pain.  Visit your dentist if you have not gone during your pregnancy. Use a soft toothbrush to brush your teeth and be gentle when you floss.  A sexual relationship may be continued unless your caregiver directs you otherwise.  Do not travel far distances unless it is absolutely necessary and only  with the approval of your caregiver.  Take prenatal classes to understand, practice, and ask questions about the labor and delivery.  Make a trial run to the hospital.  Pack your hospital bag.  Prepare the baby's nursery.  Continue to go to all your prenatal visits as directed by your caregiver. SEEK MEDICAL CARE IF:  You are unsure if you are in labor or if your water has broken.  You have dizziness.  You have mild pelvic cramps, pelvic pressure, or nagging pain in your abdominal area.  You have persistent nausea, vomiting, or diarrhea.  You have a bad smelling vaginal discharge.  You have pain with urination. SEEK IMMEDIATE MEDICAL CARE IF:   You have a fever.  You are leaking fluid from your vagina.  You have spotting or bleeding from your vagina.  You have severe abdominal cramping or pain.  You have rapid weight loss or gain.  You have shortness of breath with chest pain.  You notice sudden or extreme swelling   of your face, hands, ankles, feet, or legs.  You have not felt your baby move in over an hour.  You have severe headaches that do not go away with medicine.  You have vision changes. Document Released: 11/19/2001 Document Revised: 11/30/2013 Document Reviewed: 01/26/2013 [employer] Patient Information 2015 [employer]. This information is not intended to replace advice given to you by your health care provider. Make sure you discuss any questions you have with your health care provider.  Breastfeeding Deciding to breastfeed is one of the best choices you can make for you and your baby. A change in hormones during pregnancy causes your breast tissue to grow and increases the number and size of your milk ducts. These hormones also allow proteins, sugars, and fats from your blood supply to make breast milk in your milk-producing glands. Hormones prevent breast milk from being released before your baby is born as well as prompt milk flow after birth. Once  breastfeeding has begun, thoughts of your baby, as well as his or her sucking or crying, can stimulate the release of milk from your milk-producing glands.  BENEFITS OF BREASTFEEDING For Your Baby  Your first milk (colostrum) helps your baby's digestive system function better.   There are antibodies in your milk that help your baby fight off infections.   Your baby has a lower incidence of asthma, allergies, and sudden infant death syndrome.   The nutrients in breast milk are better for your baby than infant formulas and are designed uniquely for your baby's needs.   Breast milk improves your baby's brain development.   Your baby is less likely to develop other conditions, such as childhood obesity, asthma, or type 2 diabetes mellitus.  For You   Breastfeeding helps to create a very special bond between you and your baby.   Breastfeeding is convenient. Breast milk is always available at the correct temperature and costs nothing.   Breastfeeding helps to burn calories and helps you lose the weight gained during pregnancy.   Breastfeeding makes your uterus contract to its prepregnancy size faster and slows bleeding (lochia) after you give birth.   Breastfeeding helps to lower your risk of developing type 2 diabetes mellitus, osteoporosis, and breast or ovarian cancer later in life. SIGNS THAT YOUR BABY IS HUNGRY Early Signs of Hunger  Increased alertness or activity.  Stretching.  Movement of the head from side to side.  Movement of the head and opening of the mouth when the corner of the mouth or cheek is stroked (rooting).  Increased sucking sounds, smacking lips, cooing, sighing, or squeaking.  Hand-to-mouth movements.  Increased sucking of fingers or hands. Late Signs of Hunger  Fussing.  Intermittent crying. Extreme Signs of Hunger Signs of extreme hunger will require calming and consoling before your baby will be able to breastfeed successfully. Do not  wait for the following signs of extreme hunger to occur before you initiate breastfeeding:   Restlessness.  A loud, strong cry.   Screaming. BREASTFEEDING BASICS Breastfeeding Initiation  Find a comfortable place to sit or lie down, with your neck and back well supported.  Place a pillow or rolled up blanket under your baby to bring him or her to the level of your breast (if you are seated). Nursing pillows are specially designed to help support your arms and your baby while you breastfeed.  Make sure that your baby's abdomen is facing your abdomen.   Gently massage your breast. With your fingertips, massage from your chest   wall toward your nipple in a circular motion. This encourages milk flow. You may need to continue this action during the feeding if your milk flows slowly.  Support your breast with 4 fingers underneath and your thumb above your nipple. Make sure your fingers are well away from your nipple and your baby's mouth.   Stroke your baby's lips gently with your finger or nipple.   When your baby's mouth is open wide enough, quickly bring your baby to your breast, placing your entire nipple and as much of the colored area around your nipple (areola) as possible into your baby's mouth.   More areola should be visible above your baby's upper lip than below the lower lip.   Your baby's tongue should be between his or her lower gum and your breast.   Ensure that your baby's mouth is correctly positioned around your nipple (latched). Your baby's lips should create a seal on your breast and be turned out (everted).  It is common for your baby to suck about 2-3 minutes in order to start the flow of breast milk. Latching Teaching your baby how to latch on to your breast properly is very important. An improper latch can cause nipple pain and decreased milk supply for you and poor weight gain in your baby. Also, if your baby is not latched onto your nipple properly, he or she  may swallow some air during feeding. This can make your baby fussy. Burping your baby when you switch breasts during the feeding can help to get rid of the air. However, teaching your baby to latch on properly is still the best way to prevent fussiness from swallowing air while breastfeeding. Signs that your baby has successfully latched on to your nipple:    Silent tugging or silent sucking, without causing you pain.   Swallowing heard between every 3-4 sucks.    Muscle movement above and in front of his or her ears while sucking.  Signs that your baby has not successfully latched on to nipple:   Sucking sounds or smacking sounds from your baby while breastfeeding.  Nipple pain. If you think your baby has not latched on correctly, slip your finger into the corner of your baby's mouth to break the suction and place it between your baby's gums. Attempt breastfeeding initiation again. Signs of Successful Breastfeeding Signs from your baby:   A gradual decrease in the number of sucks or complete cessation of sucking.   Falling asleep.   Relaxation of his or her body.   Retention of a small amount of milk in his or her mouth.   Letting go of your breast by himself or herself. Signs from you:  Breasts that have increased in firmness, weight, and size 1-3 hours after feeding.   Breasts that are softer immediately after breastfeeding.  Increased milk volume, as well as a change in milk consistency and color by the fifth day of breastfeeding.   Nipples that are not sore, cracked, or bleeding. Signs That Your Baby is Getting Enough Milk  Wetting at least 3 diapers in a 24-hour period. The urine should be clear and pale yellow by age 5 days.  At least 3 stools in a 24-hour period by age 5 days. The stool should be soft and yellow.  At least 3 stools in a 24-hour period by age 7 days. The stool should be seedy and yellow.  No loss of weight greater than 10% of birth weight  during the first 3   days of age.  Average weight gain of 4-7 ounces (113-198 g) per week after age 4 days.  Consistent daily weight gain by age 5 days, without weight loss after the age of 2 weeks. After a feeding, your baby may spit up a small amount. This is common. BREASTFEEDING FREQUENCY AND DURATION Frequent feeding will help you make more milk and can prevent sore nipples and breast engorgement. Breastfeed when you feel the need to reduce the fullness of your breasts or when your baby shows signs of hunger. This is called "breastfeeding on demand." Avoid introducing a pacifier to your baby while you are working to establish breastfeeding (the first 4-6 weeks after your baby is born). After this time you may choose to use a pacifier. Research has shown that pacifier use during the first year of a baby's life decreases the risk of sudden infant death syndrome (SIDS). Allow your baby to feed on each breast as long as he or she wants. Breastfeed until your baby is finished feeding. When your baby unlatches or falls asleep while feeding from the first breast, offer the second breast. Because newborns are often sleepy in the first few weeks of life, you may need to awaken your baby to get him or her to feed. Breastfeeding times will vary from baby to baby. However, the following rules can serve as a guide to help you ensure that your baby is properly fed:  Newborns (babies 4 weeks of age or younger) may breastfeed every 1-3 hours.  Newborns should not go longer than 3 hours during the day or 5 hours during the night without breastfeeding.  You should breastfeed your baby a minimum of 8 times in a 24-hour period until you begin to introduce solid foods to your baby at around 6 months of age. BREAST MILK PUMPING Pumping and storing breast milk allows you to ensure that your baby is exclusively fed your breast milk, even at times when you are unable to breastfeed. This is especially important if you are  going back to work while you are still breastfeeding or when you are not able to be present during feedings. Your lactation consultant can give you guidelines on how long it is safe to store breast milk.  A breast pump is a machine that allows you to pump milk from your breast into a sterile bottle. The pumped breast milk can then be stored in a refrigerator or freezer. Some breast pumps are operated by hand, while others use electricity. Ask your lactation consultant which type will work best for you. Breast pumps can be purchased, but some hospitals and breastfeeding support groups lease breast pumps on a monthly basis. A lactation consultant can teach you how to hand express breast milk, if you prefer not to use a pump.  CARING FOR YOUR BREASTS WHILE YOU BREASTFEED Nipples can become dry, cracked, and sore while breastfeeding. The following recommendations can help keep your breasts moisturized and healthy:  Avoid using soap on your nipples.   Wear a supportive bra. Although not required, special nursing bras and tank tops are designed to allow access to your breasts for breastfeeding without taking off your entire bra or top. Avoid wearing underwire-style bras or extremely tight bras.  Air dry your nipples for 3-4minutes after each feeding.   Use only cotton bra pads to absorb leaked breast milk. Leaking of breast milk between feedings is normal.   Use lanolin on your nipples after breastfeeding. Lanolin helps to maintain your skin's   normal moisture barrier. If you use pure lanolin, you do not need to wash it off before feeding your baby again. Pure lanolin is not toxic to your baby. You may also hand express a few drops of breast milk and gently massage that milk into your nipples and allow the milk to air dry. In the first few weeks after giving birth, some women experience extremely full breasts (engorgement). Engorgement can make your breasts feel heavy, warm, and tender to the touch.  Engorgement peaks within 3-5 days after you give birth. The following recommendations can help ease engorgement:  Completely empty your breasts while breastfeeding or pumping. You may want to start by applying warm, moist heat (in the shower or with warm water-soaked hand towels) just before feeding or pumping. This increases circulation and helps the milk flow. If your baby does not completely empty your breasts while breastfeeding, pump any extra milk after he or she is finished.  Wear a snug bra (nursing or regular) or tank top for 1-2 days to signal your body to slightly decrease milk production.  Apply ice packs to your breasts, unless this is too uncomfortable for you.  Make sure that your baby is latched on and positioned properly while breastfeeding. If engorgement persists after 48 hours of following these recommendations, contact your health care provider or a lactation consultant. OVERALL HEALTH CARE RECOMMENDATIONS WHILE BREASTFEEDING  Eat healthy foods. Alternate between meals and snacks, eating 3 of each per day. Because what you eat affects your breast milk, some of the foods may make your baby more irritable than usual. Avoid eating these foods if you are sure that they are negatively affecting your baby.  Drink milk, fruit juice, and water to satisfy your thirst (about 10 glasses a day).   Rest often, relax, and continue to take your prenatal vitamins to prevent fatigue, stress, and anemia.  Continue breast self-awareness checks.  Avoid chewing and smoking tobacco.  Avoid alcohol and drug use. Some medicines that may be harmful to your baby can pass through breast milk. It is important to ask your health care provider before taking any medicine, including all over-the-counter and prescription medicine as well as vitamin and herbal supplements. It is possible to become pregnant while breastfeeding. If birth control is desired, ask your health care provider about options that  will be safe for your baby. SEEK MEDICAL CARE IF:   You feel like you want to stop breastfeeding or have become frustrated with breastfeeding.  You have painful breasts or nipples.  Your nipples are cracked or bleeding.  Your breasts are red, tender, or warm.  You have a swollen area on either breast.  You have a fever or chills.  You have nausea or vomiting.  You have drainage other than breast milk from your nipples.  Your breasts do not become full before feedings by the fifth day after you give birth.  You feel sad and depressed.  Your baby is too sleepy to eat well.  Your baby is having trouble sleeping.   Your baby is wetting less than 3 diapers in a 24-hour period.  Your baby has less than 3 stools in a 24-hour period.  Your baby's skin or the white part of his or her eyes becomes yellow.   Your baby is not gaining weight by 5 days of age. SEEK IMMEDIATE MEDICAL CARE IF:   Your baby is overly tired (lethargic) and does not want to wake up and feed.  Your baby   develops an unexplained fever. Document Released: 11/25/2005 Document Revised: 11/30/2013 Document Reviewed: 05/19/2013 [employer] Patient Information 2015 [employer]. This information is not intended to replace advice given to you by your health care provider. Make sure you discuss any questions you have with your health care provider.  

## 2015-06-14 ENCOUNTER — Encounter: Payer: Medicaid Other | Admitting: Obstetrics and Gynecology

## 2015-06-14 ENCOUNTER — Ambulatory Visit (INDEPENDENT_AMBULATORY_CARE_PROVIDER_SITE_OTHER): Payer: Medicaid Other | Admitting: Certified Nurse Midwife

## 2015-06-14 VITALS — BP 110/70 | HR 106 | Wt 173.0 lb

## 2015-06-14 DIAGNOSIS — O3663X0 Maternal care for excessive fetal growth, third trimester, not applicable or unspecified: Secondary | ICD-10-CM

## 2015-06-14 DIAGNOSIS — Z3493 Encounter for supervision of normal pregnancy, unspecified, third trimester: Secondary | ICD-10-CM

## 2015-06-14 DIAGNOSIS — Z3A36 36 weeks gestation of pregnancy: Secondary | ICD-10-CM

## 2015-06-14 DIAGNOSIS — Z36 Encounter for antenatal screening of mother: Secondary | ICD-10-CM

## 2015-06-14 DIAGNOSIS — Z3483 Encounter for supervision of other normal pregnancy, third trimester: Secondary | ICD-10-CM

## 2015-06-14 LAB — OB RESULTS CONSOLE GC/CHLAMYDIA
Chlamydia: NEGATIVE
Gonorrhea: NEGATIVE

## 2015-06-14 LAB — OB RESULTS CONSOLE GBS: GBS: NEGATIVE

## 2015-06-14 NOTE — Progress Notes (Signed)
Measuring greater than dates. U/S scheduled for growth. Otherwise doing well. GBS and gc and ch cx's collected today

## 2015-06-14 NOTE — Patient Instructions (Signed)
Third Trimester of Pregnancy The third trimester is from week 29 through week 42, months 7 through 9. The third trimester is a time when the fetus is growing rapidly. At the end of the ninth month, the fetus is about 20 inches in length and weighs 6-10 pounds.  BODY CHANGES Your body goes through many changes during pregnancy. The changes vary from woman to woman.   Your weight will continue to increase. You can expect to gain 25-35 pounds (11-16 kg) by the end of the pregnancy.  You may begin to get stretch marks on your hips, abdomen, and breasts.  You may urinate more often because the fetus is moving lower into your pelvis and pressing on your bladder.  You may develop or continue to have heartburn as a result of your pregnancy.  You may develop constipation because certain hormones are causing the muscles that push waste through your intestines to slow down.  You may develop hemorrhoids or swollen, bulging veins (varicose veins).  You may have pelvic pain because of the weight gain and pregnancy hormones relaxing your joints between the bones in your pelvis. Backaches may result from overexertion of the muscles supporting your posture.  You may have changes in your hair. These can include thickening of your hair, rapid growth, and changes in texture. Some women also have hair loss during or after pregnancy, or hair that feels dry or thin. Your hair will most likely return to normal after your baby is born.  Your breasts will continue to grow and be tender. A yellow discharge may leak from your breasts called colostrum.  Your belly button may stick out.  You may feel short of breath because of your expanding uterus.  You may notice the fetus "dropping," or moving lower in your abdomen.  You may have a bloody mucus discharge. This usually occurs a few days to a week before labor begins.  Your cervix becomes thin and soft (effaced) near your due date. WHAT TO EXPECT AT YOUR PRENATAL  EXAMS  You will have prenatal exams every 2 weeks until week 36. Then, you will have weekly prenatal exams. During a routine prenatal visit:  You will be weighed to make sure you and the fetus are growing normally.  Your blood pressure is taken.  Your abdomen will be measured to track your baby's growth.  The fetal heartbeat will be listened to.  Any test results from the previous visit will be discussed.  You may have a cervical check near your due date to see if you have effaced. At around 36 weeks, your caregiver will check your cervix. At the same time, your caregiver will also perform a test on the secretions of the vaginal tissue. This test is to determine if a type of bacteria, Group B streptococcus, is present. Your caregiver will explain this further. Your caregiver may ask you:  What your birth plan is.  How you are feeling.  If you are feeling the baby move.  If you have had any abnormal symptoms, such as leaking fluid, bleeding, severe headaches, or abdominal cramping.  If you have any questions. Other tests or screenings that may be performed during your third trimester include:  Blood tests that check for low iron levels (anemia).  Fetal testing to check the health, activity level, and growth of the fetus. Testing is done if you have certain medical conditions or if there are problems during the pregnancy. FALSE LABOR You may feel small, irregular contractions that   eventually go away. These are called Braxton Hicks contractions, or false labor. Contractions may last for hours, days, or even weeks before true labor sets in. If contractions come at regular intervals, intensify, or become painful, it is best to be seen by your caregiver.  SIGNS OF LABOR   Menstrual-like cramps.  Contractions that are 5 minutes apart or less.  Contractions that start on the top of the uterus and spread down to the lower abdomen and back.  A sense of increased pelvic pressure or back  pain.  A watery or bloody mucus discharge that comes from the vagina. If you have any of these signs before the 37th week of pregnancy, call your caregiver right away. You need to go to the hospital to get checked immediately. HOME CARE INSTRUCTIONS   Avoid all smoking, herbs, alcohol, and unprescribed drugs. These chemicals affect the formation and growth of the baby.  Follow your caregiver's instructions regarding medicine use. There are medicines that are either safe or unsafe to take during pregnancy.  Exercise only as directed by your caregiver. Experiencing uterine cramps is a good sign to stop exercising.  Continue to eat regular, healthy meals.  Wear a good support bra for breast tenderness.  Do not use hot tubs, steam rooms, or saunas.  Wear your seat belt at all times when driving.  Avoid raw meat, uncooked cheese, cat litter boxes, and soil used by cats. These carry germs that can cause birth defects in the baby.  Take your prenatal vitamins.  Try taking a stool softener (if your caregiver approves) if you develop constipation. Eat more high-fiber foods, such as fresh vegetables or fruit and whole grains. Drink plenty of fluids to keep your urine clear or pale yellow.  Take warm sitz baths to soothe any pain or discomfort caused by hemorrhoids. Use hemorrhoid cream if your caregiver approves.  If you develop varicose veins, wear support hose. Elevate your feet for 15 minutes, 3-4 times a day. Limit salt in your diet.  Avoid heavy lifting, wear low heal shoes, and practice good posture.  Rest a lot with your legs elevated if you have leg cramps or low back pain.  Visit your dentist if you have not gone during your pregnancy. Use a soft toothbrush to brush your teeth and be gentle when you floss.  A sexual relationship may be continued unless your caregiver directs you otherwise.  Do not travel far distances unless it is absolutely necessary and only with the approval  of your caregiver.  Take prenatal classes to understand, practice, and ask questions about the labor and delivery.  Make a trial run to the hospital.  Pack your hospital bag.  Prepare the baby's nursery.  Continue to go to all your prenatal visits as directed by your caregiver. SEEK MEDICAL CARE IF:  You are unsure if you are in labor or if your water has broken.  You have dizziness.  You have mild pelvic cramps, pelvic pressure, or nagging pain in your abdominal area.  You have persistent nausea, vomiting, or diarrhea.  You have a bad smelling vaginal discharge.  You have pain with urination. SEEK IMMEDIATE MEDICAL CARE IF:   You have a fever.  You are leaking fluid from your vagina.  You have spotting or bleeding from your vagina.  You have severe abdominal cramping or pain.  You have rapid weight loss or gain.  You have shortness of breath with chest pain.  You notice sudden or extreme swelling   of your face, hands, ankles, feet, or legs.  You have not felt your baby move in over an hour.  You have severe headaches that do not go away with medicine.  You have vision changes. Document Released: 11/19/2001 Document Revised: 11/30/2013 Document Reviewed: 01/26/2013 ExitCare Patient Information 2015 [employer]. This information is not intended to replace advice given to you by your health care provider. Make sure you discuss any questions you have with your health care provider.  

## 2015-06-15 LAB — GC/CHLAMYDIA PROBE AMP
CT Probe RNA: NEGATIVE
GC Probe RNA: NEGATIVE

## 2015-06-16 LAB — CULTURE, BETA STREP (GROUP B ONLY)

## 2015-06-20 ENCOUNTER — Ambulatory Visit (HOSPITAL_COMMUNITY)
Admission: RE | Admit: 2015-06-20 | Discharge: 2015-06-20 | Disposition: A | Discharge status: Home / Self Care | Payer: Medicaid Other | Source: Ambulatory Visit | Attending: Certified Nurse Midwife | Admitting: Certified Nurse Midwife

## 2015-06-20 ENCOUNTER — Encounter (HOSPITAL_COMMUNITY): Payer: Self-pay

## 2015-06-20 DIAGNOSIS — Z3A00 Weeks of gestation of pregnancy not specified: Secondary | ICD-10-CM | POA: Diagnosis not present

## 2015-06-20 DIAGNOSIS — O3660X0 Maternal care for excessive fetal growth, unspecified trimester, not applicable or unspecified: Secondary | ICD-10-CM | POA: Diagnosis not present

## 2015-06-20 DIAGNOSIS — Z3A37 37 weeks gestation of pregnancy: Secondary | ICD-10-CM | POA: Insufficient documentation

## 2015-06-20 DIAGNOSIS — Z3493 Encounter for supervision of normal pregnancy, unspecified, third trimester: Secondary | ICD-10-CM

## 2015-06-20 DIAGNOSIS — O26849 Uterine size-date discrepancy, unspecified trimester: Secondary | ICD-10-CM | POA: Insufficient documentation

## 2015-06-21 ENCOUNTER — Ambulatory Visit (INDEPENDENT_AMBULATORY_CARE_PROVIDER_SITE_OTHER): Payer: Medicaid Other | Admitting: Obstetrics and Gynecology

## 2015-06-21 ENCOUNTER — Encounter: Payer: Self-pay | Admitting: Obstetrics and Gynecology

## 2015-06-21 VITALS — BP 110/70 | HR 99 | Wt 176.0 lb

## 2015-06-21 DIAGNOSIS — O99330 Smoking (tobacco) complicating pregnancy, unspecified trimester: Secondary | ICD-10-CM

## 2015-06-21 DIAGNOSIS — O99350 Diseases of the nervous system complicating pregnancy, unspecified trimester: Secondary | ICD-10-CM

## 2015-06-21 DIAGNOSIS — O283 Abnormal ultrasonic finding on antenatal screening of mother: Secondary | ICD-10-CM

## 2015-06-21 DIAGNOSIS — G40909 Epilepsy, unspecified, not intractable, without status epilepticus: Secondary | ICD-10-CM

## 2015-06-21 DIAGNOSIS — Z3483 Encounter for supervision of other normal pregnancy, third trimester: Secondary | ICD-10-CM

## 2015-06-21 DIAGNOSIS — Z3493 Encounter for supervision of normal pregnancy, unspecified, third trimester: Secondary | ICD-10-CM

## 2015-06-21 NOTE — Progress Notes (Signed)
Subjective:  Jasmine Ellis is a 24 y.o. G2P1001 at 8243w2d being seen today for ongoing prenatal care.  Patient reports no complaints.  Contractions: Irregular.  Vag. Bleeding: None. Movement: Present. Denies leaking of fluid.   The following portions of the patient's history were reviewed and updated as appropriate: allergies, current medications, past family history, past medical history, past social history, past surgical history and problem list.   Objective:   Filed Vitals:   06/21/15 0957  BP: 110/70  Pulse: 99  Weight: 176 lb (79.833 kg)    Fetal Status: Fetal Heart Rate (bpm): 136   Movement: Present     General:  Alert, oriented and cooperative. Patient is in no acute distress.  Skin: Skin is warm and dry. No rash noted.   Cardiovascular: Normal heart rate noted  Respiratory: Normal respiratory effort, no problems with respiration noted  Abdomen: Soft, gravid, appropriate for gestational age. Pain/Pressure: Present     Vaginal: Vag. Bleeding: None.    Vag D/C Character: Thin  Cervix: Not evaluated        Extremities: Normal range of motion.  Edema: Trace  Mental Status: Normal mood and affect. Normal behavior. Normal judgment and thought content.   Urinalysis:      Assessment and Plan:  Pregnancy: G2P1001 at 5343w2d  1. Supervision of normal pregnancy, third trimester Patient is doing well without complaints Ultrasound report reviewed with patient 06/20/15- EFW 7lb7oz (85%tile)  2. Tobacco smoking affecting pregnancy Smoking cessation discussed  3. Epilepsy affecting pregnancy, antepartum No seizure since prior to pregnancy  4. Echogenic bowel of fetus on prenatal ultrasound    Term labor symptoms and general obstetric precautions including but not limited to vaginal bleeding, contractions, leaking of fluid and fetal movement were reviewed in detail with the patient.  Please refer to After Visit Summary for other counseling recommendations.   Return in about 1  week (around 06/28/2015).   Catalina AntiguaPeggy Tashianna Broome, MD

## 2015-06-29 ENCOUNTER — Encounter (HOSPITAL_COMMUNITY): Payer: Self-pay | Admitting: *Deleted

## 2015-06-29 ENCOUNTER — Ambulatory Visit (INDEPENDENT_AMBULATORY_CARE_PROVIDER_SITE_OTHER): Payer: Medicaid Other | Admitting: Obstetrics & Gynecology

## 2015-06-29 ENCOUNTER — Inpatient Hospital Stay (HOSPITAL_COMMUNITY)
Admission: AD | Admit: 2015-06-29 | Discharge: 2015-06-29 | Disposition: A | Discharge status: Home / Self Care | Payer: Medicaid Other | Source: Ambulatory Visit | Attending: Family Medicine | Admitting: Family Medicine

## 2015-06-29 VITALS — BP 114/78 | HR 96 | Wt 176.0 lb

## 2015-06-29 DIAGNOSIS — M419 Scoliosis, unspecified: Secondary | ICD-10-CM | POA: Diagnosis present

## 2015-06-29 DIAGNOSIS — Z3493 Encounter for supervision of normal pregnancy, unspecified, third trimester: Secondary | ICD-10-CM

## 2015-06-29 DIAGNOSIS — Z3483 Encounter for supervision of other normal pregnancy, third trimester: Secondary | ICD-10-CM

## 2015-06-29 DIAGNOSIS — O99334 Smoking (tobacco) complicating childbirth: Principal | ICD-10-CM | POA: Diagnosis present

## 2015-06-29 DIAGNOSIS — Z3A38 38 weeks gestation of pregnancy: Secondary | ICD-10-CM | POA: Diagnosis present

## 2015-06-29 DIAGNOSIS — F17210 Nicotine dependence, cigarettes, uncomplicated: Secondary | ICD-10-CM | POA: Diagnosis present

## 2015-06-29 NOTE — MAU Note (Signed)
PT  SAYS SHE  HAS BEEN HURTING  SINCE 847PM.  VE IN OFFICE  TODAY - AT [facility]    1 CM.  DENIES HSV AND MRSA.   GBS- NEG

## 2015-06-29 NOTE — Patient Instructions (Signed)
Return to clinic for any obstetric concerns or go to MAU for evaluation  

## 2015-06-29 NOTE — Progress Notes (Signed)
Subjective:  Jasmine Ellis is a 24 y.o. G2P1001 at [redacted]w[redacted]d being seen today for ongoing prenatal care.  Patient reports no complaints.  Contractions: Irregular.  Vag. Bleeding: None. Movement: Present. Denies leaking of fluid.   The following portions of the patient's history were reviewed and updated as appropriate: allergies, current medications, past family history, past medical history, past social history, past surgical history and problem list.   Objective:   Filed Vitals:   06/29/15 1040  BP: 114/78  Pulse: 96  Weight: 176 lb (79.833 kg)    Fetal Status: Fetal Heart Rate (bpm): 148 Fundal Height: 44 cm Movement: Present  Presentation: Vertex  General:  Alert, oriented and cooperative. Patient is in no acute distress.  Skin: Skin is warm and dry. No rash noted.   Cardiovascular: Normal heart rate noted  Respiratory: Normal respiratory effort, no problems with respiration noted  Abdomen: Soft, gravid, appropriate for gestational age. Pain/Pressure: Present     Vaginal: Vag. Bleeding: None.    Vag D/C Character: Thin  Cervix: Exam revealed Dilation: 1 Effacement (%): 50 Station: -3. Membranes swept as per patient request.  Extremities: Normal range of motion.  Edema: Trace  Mental Status: Normal mood and affect. Normal behavior. Normal judgment and thought content.   Urinalysis: Urine Protein: Negative Urine Glucose: Negative  Assessment and Plan:  Pregnancy: G2P1001 at [redacted]w[redacted]d  1. Supervision of normal pregnancy, third trimester Had normal growth scan recently on 06/20/15 [redacted]w[redacted]d 3380g (7 lb 7oz)/85%, AC 96%, AFI 22 cm, cephalic. Membranes swept today. Term labor symptoms and general obstetric precautions including but not limited to vaginal bleeding, contractions, leaking of fluid and fetal movement were reviewed in detail with the patient. Please refer to After Visit Summary for other counseling recommendations.  Return in about 1 week (around 07/06/2015) for OB Visit.   Tereso Newcomer, MD

## 2015-06-30 ENCOUNTER — Inpatient Hospital Stay (HOSPITAL_COMMUNITY): Payer: Medicaid Other | Admitting: Anesthesiology

## 2015-06-30 ENCOUNTER — Encounter (HOSPITAL_COMMUNITY): Payer: Self-pay

## 2015-06-30 ENCOUNTER — Inpatient Hospital Stay (HOSPITAL_COMMUNITY)
Admission: AD | Admit: 2015-06-30 | Discharge: 2015-07-02 | DRG: 774 | Disposition: A | Discharge status: Home / Self Care | Payer: Medicaid Other | Source: Ambulatory Visit | Attending: Family Medicine | Admitting: Family Medicine

## 2015-06-30 DIAGNOSIS — F17210 Nicotine dependence, cigarettes, uncomplicated: Secondary | ICD-10-CM | POA: Diagnosis present

## 2015-06-30 DIAGNOSIS — O4292 Full-term premature rupture of membranes, unspecified as to length of time between rupture and onset of labor: Secondary | ICD-10-CM

## 2015-06-30 DIAGNOSIS — M419 Scoliosis, unspecified: Secondary | ICD-10-CM | POA: Diagnosis present

## 2015-06-30 DIAGNOSIS — O99334 Smoking (tobacco) complicating childbirth: Secondary | ICD-10-CM | POA: Diagnosis present

## 2015-06-30 DIAGNOSIS — Z3A38 38 weeks gestation of pregnancy: Secondary | ICD-10-CM | POA: Diagnosis not present

## 2015-06-30 HISTORY — DX: Unspecified convulsions: R56.9

## 2015-06-30 LAB — CBC WITH DIFFERENTIAL/PLATELET
BASOS ABS: 0 10*3/uL (ref 0.0–0.1)
BASOS PCT: 0 % (ref 0–1)
Basophils Absolute: 0 10*3/uL (ref 0.0–0.1)
Basophils Relative: 0 % (ref 0–1)
Eosinophils Absolute: 0.1 10*3/uL (ref 0.0–0.7)
Eosinophils Absolute: 0.2 10*3/uL (ref 0.0–0.7)
Eosinophils Relative: 1 % (ref 0–5)
Eosinophils Relative: 1 % (ref 0–5)
HCT: 31.7 % — ABNORMAL LOW (ref 36.0–46.0)
HEMATOCRIT: 26.3 % — AB (ref 36.0–46.0)
HEMOGLOBIN: 8.8 g/dL — AB (ref 12.0–15.0)
Hemoglobin: 10.8 g/dL — ABNORMAL LOW (ref 12.0–15.0)
LYMPHS ABS: 3.3 10*3/uL (ref 0.7–4.0)
LYMPHS PCT: 17 % (ref 12–46)
Lymphocytes Relative: 16 % (ref 12–46)
Lymphs Abs: 3 10*3/uL (ref 0.7–4.0)
MCH: 29.8 pg (ref 26.0–34.0)
MCH: 29.6 pg (ref 26.0–34.0)
MCHC: 34.1 g/dL (ref 30.0–36.0)
MCHC: 33.5 g/dL (ref 30.0–36.0)
MCV: 87.3 fL (ref 78.0–100.0)
MCV: 88.6 fL (ref 78.0–100.0)
MONO ABS: 0.9 10*3/uL (ref 0.1–1.0)
MONOS PCT: 6 % (ref 3–12)
MONOS PCT: 5 % (ref 3–12)
Monocytes Absolute: 1.1 10*3/uL — ABNORMAL HIGH (ref 0.1–1.0)
NEUTROS ABS: 15 10*3/uL — AB (ref 1.7–7.7)
NEUTROS PCT: 77 % (ref 43–77)
Neutro Abs: 15.1 10*3/uL — ABNORMAL HIGH (ref 1.7–7.7)
Neutrophils Relative %: 77 % (ref 43–77)
PLATELETS: 317 10*3/uL (ref 150–400)
Platelets: 293 10*3/uL (ref 150–400)
RBC: 3.63 MIL/uL — AB (ref 3.87–5.11)
RBC: 2.97 MIL/uL — ABNORMAL LOW (ref 3.87–5.11)
RDW: 12.8 % (ref 11.5–15.5)
RDW: 12.8 % (ref 11.5–15.5)
WBC: 19.3 10*3/uL — ABNORMAL HIGH (ref 4.0–10.5)
WBC: 19.6 10*3/uL — ABNORMAL HIGH (ref 4.0–10.5)

## 2015-06-30 LAB — CBC
HEMATOCRIT: 36.1 % (ref 36.0–46.0)
Hemoglobin: 12.5 g/dL (ref 12.0–15.0)
MCH: 30 pg (ref 26.0–34.0)
MCHC: 34.6 g/dL (ref 30.0–36.0)
MCV: 86.8 fL (ref 78.0–100.0)
Platelets: 331 10*3/uL (ref 150–400)
RBC: 4.16 MIL/uL (ref 3.87–5.11)
RDW: 12.8 % (ref 11.5–15.5)
WBC: 19.6 10*3/uL — AB (ref 4.0–10.5)

## 2015-06-30 LAB — POSTPARTUM HEMORRHAGE PROTOCOL (BB NOTIFICATION)

## 2015-06-30 LAB — RPR: RPR Ser Ql: NONREACTIVE

## 2015-06-30 MED ORDER — LIDOCAINE HCL (PF) 1 % IJ SOLN
30.0000 mL | INTRAMUSCULAR | Status: DC | PRN
  Filled 2015-06-30: qty 30

## 2015-06-30 MED ORDER — OXYTOCIN 40 UNITS IN LACTATED RINGERS INFUSION - SIMPLE MED
62.5000 mL/h | INTRAVENOUS | Status: DC
  Filled 2015-06-30: qty 1000

## 2015-06-30 MED ORDER — SIMETHICONE 80 MG PO CHEW: 80.0000 mg | CHEWABLE_TABLET | ORAL | Status: DC | PRN

## 2015-06-30 MED ORDER — MISOPROSTOL 200 MCG PO TABS
ORAL_TABLET | ORAL | Status: AC
  Filled 2015-06-30: qty 5

## 2015-06-30 MED ORDER — MISOPROSTOL 200 MCG PO TABS: 1000.0000 ug | ORAL_TABLET | Freq: Once | ORAL | Status: DC

## 2015-06-30 MED ORDER — ZOLPIDEM TARTRATE 5 MG PO TABS: 5.0000 mg | ORAL_TABLET | Freq: Every evening | ORAL | Status: DC | PRN

## 2015-06-30 MED ORDER — METHYLERGONOVINE MALEATE 0.2 MG PO TABS: 0.2000 mg | ORAL_TABLET | ORAL | Status: DC | PRN

## 2015-06-30 MED ORDER — ONDANSETRON HCL 4 MG/2ML IJ SOLN
4.0000 mg | Freq: Four times a day (QID) | INTRAMUSCULAR | Status: DC | PRN
Start: 2015-06-30 — End: 2015-06-30

## 2015-06-30 MED ORDER — PHENYLEPHRINE 40 MCG/ML (10ML) SYRINGE FOR IV PUSH (FOR BLOOD PRESSURE SUPPORT)
80.0000 ug | PREFILLED_SYRINGE | INTRAVENOUS | Status: DC | PRN
  Filled 2015-06-30: qty 20

## 2015-06-30 MED ORDER — FLEET ENEMA 7-19 GM/118ML RE ENEM
1.0000 | ENEMA | RECTAL | Status: DC | PRN
Start: 2015-06-30 — End: 2015-06-30

## 2015-06-30 MED ORDER — OXYTOCIN 40 UNITS IN LACTATED RINGERS INFUSION - SIMPLE MED: 62.5000 mL/h | INTRAVENOUS | Status: DC | PRN

## 2015-06-30 MED ORDER — DIPHENHYDRAMINE HCL 50 MG/ML IJ SOLN: 12.5000 mg | INTRAMUSCULAR | Status: DC | PRN

## 2015-06-30 MED ORDER — OXYCODONE-ACETAMINOPHEN 5-325 MG PO TABS
1.0000 | ORAL_TABLET | ORAL | Status: DC | PRN
  Administered 2015-06-30: 1 via ORAL
  Filled 2015-06-30 (×3): qty 1

## 2015-06-30 MED ORDER — CITRIC ACID-SODIUM CITRATE 334-500 MG/5ML PO SOLN
30.0000 mL | ORAL | Status: DC | PRN
Start: 2015-06-30 — End: 2015-06-30

## 2015-06-30 MED ORDER — FENTANYL CITRATE (PF) 100 MCG/2ML IJ SOLN
INTRAMUSCULAR | Status: AC
  Administered 2015-06-30: 100 ug via INTRAVENOUS
  Filled 2015-06-30: qty 4

## 2015-06-30 MED ORDER — OXYCODONE-ACETAMINOPHEN 5-325 MG PO TABS
2.0000 | ORAL_TABLET | ORAL | Status: DC | PRN
  Administered 2015-06-30 – 2015-07-02 (×8): 2 via ORAL
  Filled 2015-06-30 (×11): qty 2

## 2015-06-30 MED ORDER — BENZOCAINE-MENTHOL 20-0.5 % EX AERO: 1.0000 "application " | INHALATION_SPRAY | CUTANEOUS | Status: DC | PRN

## 2015-06-30 MED ORDER — ACETAMINOPHEN 325 MG PO TABS: 650.0000 mg | ORAL_TABLET | ORAL | Status: DC | PRN

## 2015-06-30 MED ORDER — FENTANYL CITRATE (PF) 100 MCG/2ML IJ SOLN
100.0000 ug | Freq: Once | INTRAMUSCULAR | Status: AC
  Administered 2015-06-30: 100 ug via INTRAVENOUS

## 2015-06-30 MED ORDER — LACTATED RINGERS IV SOLN: INTRAVENOUS | Status: DC

## 2015-06-30 MED ORDER — LANOLIN HYDROUS EX OINT: TOPICAL_OINTMENT | CUTANEOUS | Status: DC | PRN

## 2015-06-30 MED ORDER — LACTATED RINGERS IV BOLUS (SEPSIS)
1000.0000 mL | Freq: Once | INTRAVENOUS | Status: AC
  Administered 2015-06-30: 1000 mL via INTRAVENOUS

## 2015-06-30 MED ORDER — EPHEDRINE 5 MG/ML INJ: 10.0000 mg | INTRAVENOUS | Status: DC | PRN

## 2015-06-30 MED ORDER — LIDOCAINE HCL (PF) 1 % IJ SOLN
INTRAMUSCULAR | Status: DC | PRN
  Administered 2015-06-30: 4 mL
  Administered 2015-06-30: 6 mL

## 2015-06-30 MED ORDER — FENTANYL 2.5 MCG/ML BUPIVACAINE 1/10 % EPIDURAL INFUSION (WH - ANES)
14.0000 mL/h | INTRAMUSCULAR | Status: DC | PRN
  Administered 2015-06-30: 14 mL/h via EPIDURAL
  Filled 2015-06-30: qty 125

## 2015-06-30 MED ORDER — FLEET ENEMA 7-19 GM/118ML RE ENEM: 1.0000 | ENEMA | Freq: Every day | RECTAL | Status: DC | PRN

## 2015-06-30 MED ORDER — IBUPROFEN 600 MG PO TABS
600.0000 mg | ORAL_TABLET | Freq: Four times a day (QID) | ORAL | Status: DC
  Administered 2015-06-30 – 2015-07-02 (×9): 600 mg via ORAL
  Filled 2015-06-30 (×9): qty 1

## 2015-06-30 MED ORDER — DIBUCAINE 1 % RE OINT: 1.0000 "application " | TOPICAL_OINTMENT | RECTAL | Status: DC | PRN

## 2015-06-30 MED ORDER — LACTATED RINGERS IV SOLN
500.0000 mL | INTRAVENOUS | Status: DC | PRN
  Administered 2015-06-30: 500 mL via INTRAVENOUS

## 2015-06-30 MED ORDER — OXYCODONE-ACETAMINOPHEN 5-325 MG PO TABS: 1.0000 | ORAL_TABLET | ORAL | Status: DC | PRN

## 2015-06-30 MED ORDER — FERROUS SULFATE 325 (65 FE) MG PO TABS
325.0000 mg | ORAL_TABLET | Freq: Two times a day (BID) | ORAL | Status: DC
  Administered 2015-07-01 – 2015-07-02 (×3): 325 mg via ORAL
  Filled 2015-06-30 (×4): qty 1

## 2015-06-30 MED ORDER — TETANUS-DIPHTH-ACELL PERTUSSIS 5-2.5-18.5 LF-MCG/0.5 IM SUSP: 0.5000 mL | Freq: Once | INTRAMUSCULAR | Status: DC

## 2015-06-30 MED ORDER — OXYTOCIN BOLUS FROM INFUSION
500.0000 mL | INTRAVENOUS | Status: DC
  Administered 2015-06-30: 500 mL via INTRAVENOUS

## 2015-06-30 MED ORDER — ACETAMINOPHEN 325 MG PO TABS
650.0000 mg | ORAL_TABLET | ORAL | Status: DC | PRN
  Filled 2015-06-30: qty 2

## 2015-06-30 MED ORDER — MEASLES, MUMPS & RUBELLA VAC ~~LOC~~ INJ
0.5000 mL | INJECTION | Freq: Once | SUBCUTANEOUS | Status: DC
  Filled 2015-06-30: qty 0.5

## 2015-06-30 MED ORDER — DIPHENHYDRAMINE HCL 25 MG PO CAPS: 25.0000 mg | ORAL_CAPSULE | Freq: Four times a day (QID) | ORAL | Status: DC | PRN

## 2015-06-30 MED ORDER — SENNOSIDES-DOCUSATE SODIUM 8.6-50 MG PO TABS
2.0000 | ORAL_TABLET | ORAL | Status: DC
  Administered 2015-06-30 – 2015-07-01 (×2): 2 via ORAL
  Filled 2015-06-30 (×2): qty 2

## 2015-06-30 MED ORDER — BISACODYL 10 MG RE SUPP
10.0000 mg | Freq: Every day | RECTAL | Status: DC | PRN
  Filled 2015-06-30: qty 1

## 2015-06-30 MED ORDER — TOPIRAMATE 25 MG PO TABS
25.0000 mg | ORAL_TABLET | Freq: Two times a day (BID) | ORAL | Status: DC
  Administered 2015-07-01 – 2015-07-02 (×3): 25 mg via ORAL
  Filled 2015-06-30 (×7): qty 1

## 2015-06-30 MED ORDER — FENTANYL 2.5 MCG/ML BUPIVACAINE 1/10 % EPIDURAL INFUSION (WH - ANES)
14.0000 mL/h | INTRAMUSCULAR | Status: DC | PRN
Start: 2015-06-30 — End: 2015-06-30
  Administered 2015-06-30: 14 mL/h via EPIDURAL

## 2015-06-30 MED ORDER — ONDANSETRON HCL 4 MG/2ML IJ SOLN: 4.0000 mg | INTRAMUSCULAR | Status: DC | PRN

## 2015-06-30 MED ORDER — METHYLERGONOVINE MALEATE 0.2 MG/ML IJ SOLN: 0.2000 mg | INTRAMUSCULAR | Status: DC | PRN

## 2015-06-30 MED ORDER — PRENATAL MULTIVITAMIN CH
1.0000 | ORAL_TABLET | Freq: Every day | ORAL | Status: DC
  Administered 2015-06-30 – 2015-07-02 (×3): 1 via ORAL
  Filled 2015-06-30 (×3): qty 1

## 2015-06-30 MED ORDER — MISOPROSTOL 200 MCG PO TABS
ORAL_TABLET | ORAL | Status: AC
  Administered 2015-06-30: 1000 ug
  Filled 2015-06-30: qty 5

## 2015-06-30 MED ORDER — AMPHETAMINE-DEXTROAMPHETAMINE 10 MG PO TABS: 10.0000 mg | ORAL_TABLET | Freq: Every day | ORAL | Status: DC

## 2015-06-30 MED ORDER — WITCH HAZEL-GLYCERIN EX PADS: 1.0000 "application " | MEDICATED_PAD | CUTANEOUS | Status: DC | PRN

## 2015-06-30 MED ORDER — ONDANSETRON HCL 4 MG PO TABS: 4.0000 mg | ORAL_TABLET | ORAL | Status: DC | PRN

## 2015-06-30 MED ORDER — OXYCODONE-ACETAMINOPHEN 5-325 MG PO TABS: 2.0000 | ORAL_TABLET | ORAL | Status: DC | PRN

## 2015-06-30 NOTE — Progress Notes (Signed)
At 1500, patient was complaining of more cramping. Got her up to bathroom and she was able to void large amount. Patient was now 1/u and although firm was able to express a handful of small clots. Since patient had a continuous trickle of blood, continued fundal massage.  Expressed large amount of clots. Called a code hemorrhage.  Continued massaging and expressing clots. Hemorrhage team came. IV of LR was started and put on pressure pump.  Cytotec  given per rectum. Artelia Laroche CMW did manual extraction of clots and massaged out more clots. Patient pushed out several small clots and voided. Foley catheter was inserted. Only returned in catheter. Only slow trickle of blood after that which only lasted a few minutes. Weight of pads and linens showed blood loss at . Patient received of LR over 1hr while team worked to control bleeding. IV then slowed to 167mL/hr. Continued to monitor patient's bleeding every for 1 hour with no clots or heavy bleeding.

## 2015-06-30 NOTE — MAU Note (Signed)
PT  SAYS   AFTER  WENT HOME   [facility]  STRONGER-    THEN  THINKS  SROM-

## 2015-06-30 NOTE — Progress Notes (Signed)
Called to code hemorrage for this patient. Pt. Did not require respiratory support at this time. I was dismissed by [facility].

## 2015-06-30 NOTE — H&P (Signed)
Jasmine Ellis is a 24 y.o. female presenting for ROM/labor. Maternal Medical History:  Reason for admission: Contractions.   Contractions: Onset was 3-5 hours ago.   Frequency: irregular.   Perceived severity is strong.    Fetal activity: Perceived fetal activity is normal.   Last perceived fetal movement was within the past hour.      OB History    Gravida Para Term Preterm AB TAB SAB Ectopic Multiple Living   Past Medical History  Diagnosis Date  . Migraine   . Anxiety   . Scoliosis   . Seizures    Past Surgical History  Procedure Laterality Date  . Finger surgery      23 yo   Family History: family history includes Drug abuse in her maternal grandfather and paternal grandfather; Hypertension in her maternal grandmother and paternal grandmother. Social History:  reports that she has been smoking Cigarettes.  She has been smoking about 0.25 packs per day. She has never used smokeless tobacco. She reports that she does not drink alcohol or use illicit drugs.   Prenatal Transfer Tool  Maternal Diabetes: No Genetic Screening: Normal Maternal Ultrasounds/Referrals: Normal Fetal Ultrasounds or other Referrals:  None Maternal Substance Abuse:  No Significant Maternal Medications:  None Significant Maternal Lab Results:  Lab values include: Group B Strep negative Other Comments:  None  Review of Systems  Constitutional: Negative.   HENT: Negative.   Eyes: Negative.   Respiratory: Negative.   Cardiovascular: Negative.   Gastrointestinal: Negative.   Genitourinary: Negative.   Musculoskeletal: Negative.   Skin: Negative.   Neurological: Negative.   Endo/Heme/Allergies: Negative.   Psychiatric/Behavioral: Negative.     Dilation: 10 Effacement (%): 100 Station: +2 Exam by:: [employer], RN Blood pressure 141/84, pulse 113, temperature 98.1 F (36.7 C), temperature source Oral, resp. rate 18, height 5' (1.524 m), weight 79.833 kg (176 lb),  last menstrual period 10/03/2014, SpO2 97 %. Exam Physical Exam  Constitutional: She appears well-developed.  HENT:  Head: Normocephalic.  Eyes: EOM are normal.  Neck: Normal range of motion.  Cardiovascular: Normal rate.   Respiratory: Effort normal.  GI: Soft.  Genitourinary: Vagina normal and uterus normal.  Musculoskeletal: Normal range of motion.  Neurological: She is alert.  Skin: Skin is warm and dry.  Psychiatric: She has a normal mood and affect. Her behavior is normal. Judgment and thought content normal.    Prenatal labs: ABO, Rh: --/--/AB POS (07/22 0230) Antibody: NEG (07/22 0230) Rubella: 1.97 (12/17 1616) RPR: NON REAC (05/06 1039)  HBsAg: NEGATIVE (12/17 1616)  HIV: NONREACTIVE (05/06 1039)  GBS: Negative (07/06 0000) negative  Assessment/Plan: G2 P1 in active labor. Epidural in place; continue expectant mangement.   [name],Arshia Rondon, SNM  06/30/2015, 6:30 AM

## 2015-06-30 NOTE — Progress Notes (Signed)
Manual uterine sweep by Wynelle Bourgeois, CNM

## 2015-06-30 NOTE — Progress Notes (Signed)
Delivery of live viable female by Drenda Freeze C.-CNM, APGAR 8,9

## 2015-06-30 NOTE — Anesthesia Procedure Notes (Signed)

## 2015-06-30 NOTE — Anesthesia Preprocedure Evaluation (Signed)

## 2015-06-30 NOTE — Lactation Note (Signed)
This note was copied from the chart of Jasmine Ellis. Lactation Consultation Note  Patient Name: Jasmine Ellis [id number] Date: 06/30/2015 Reason for consult: Initial assessment Baby 8 hours old. Mom reports that she nursed first child 4 months without any issues, and this baby is latching and nursing well. Mom states that she is seeing colostrum when baby latching. Enc mom to nurse with cues, and offer lots of STS. Mom given [facility] brochure, aware of OP/BFSG, community resources, and [facility] phone line assistance after D/C.  Maternal Data Has patient been taught Hand Expression?: Yes (Per mom.) Does the patient have breastfeeding experience prior to this delivery?: Yes  Feeding    LATCH Score/Interventions                      Lactation Tools Discussed/Used     Consult Status Consult Status: Follow-up Date: 07/01/15 Follow-up type: In-patient    Geralynn Ochs 06/30/2015, 2:44 PM

## 2015-06-30 NOTE — Progress Notes (Signed)
CSW acknowledges consult for history of anxiety and THC use prior to pregnancy.  CSW screening out referral at this time since history of anxiety is greater than 3 years ago.  No symptoms/concerns noted in MOB's chart.    No documented THC use during pregnancy. MOB had a +UDS for THC in 2010.  Infant does not meet hospital criteria for drug screen.   Elanna Bert, LCSW [phone] 

## 2015-06-30 NOTE — Anesthesia Postprocedure Evaluation (Signed)
  Anesthesia Post-op Note  Patient: Jasmine Ellis  Procedure(s) Performed: * No procedures listed *  Patient Location: Mother/Baby  Anesthesia Type:Epidural  Level of Consciousness: awake, alert , oriented and patient cooperative  Airway and Oxygen Therapy: Patient Spontanous Breathing  Post-op Pain: none  Post-op Assessment: Post-op Vital signs reviewed, Patient's Cardiovascular Status Stable, Respiratory Function Stable, Patent Airway, No headache, No backache and Patient able to bend at knees              Post-op Vital Signs: Reviewed and stable  Last Vitals:  Filed Vitals:   06/30/15 0945  BP: 110/61  Pulse: 102  Temp: 36.6 C  Resp: 18    Complications: No apparent anesthesia complications

## 2015-07-01 NOTE — Progress Notes (Signed)
Patient ID: Jasmine Ellis, female   DOB: 06/05/1991, 24 y.o.   MRN: [id number] Post Partum Day 1  Subjective: Jasmine Ellis is a 24 y.o. [id number] [redacted]w[redacted]d s/p SVD.  No acute events overnight.  Pt denies problems with ambulating, voiding or po intake.  She denies nausea or vomiting.  Pt c/o cramping abdominal pain which is moderately controlled with medication.  She has had flatus. She has had bowel movement.  Lochia Small.  Plan for birth control is possibly bilateral tubal ligation.  Method of Feeding: Breast  Objective: BP 91/41 mmHg  Pulse 72  Temp(Src) 98.4 F (36.9 C) (Oral)  Resp 16  Ht 5' (1.524 m)  Wt 79.833 kg (176 lb)  BMI 34.37 kg/m2  SpO2 99%  LMP 10/03/2014  Breastfeeding? Unknown  Physical Exam:  General: alert, cooperative and no distress Lochia:normal flow Chest: CTAB Heart: RRR no m/r/g Abdomen: +BS, soft, nontender, fundus firm at/below umbilicus Uterine Fundus: firm, midline TTP, lateral nontender. DVT Evaluation: No evidence of DVT seen on physical exam. Extremities: trace edema   Recent Labs  06/30/15 1514 06/30/15 1809  HGB 10.8* 8.8*  HCT 31.7* 26.3*    Assessment/Plan:  ASSESSMENT: Jasmine Ellis is a 24 y.o. G2P2002 [redacted]w[redacted]d ppd #1 s/p SVD doing well. Appropriate fundal tenderness w/out concern for endometritis.   Sign BTL papers ASAP at office.  Continue to monitor for nonresolving bleeding, Sx of anemia 2/2 PPH. Plan for discharge tomorrow.   LOS: 1 day   [place], [place] 07/01/2015 10:28 AM

## 2015-07-02 ENCOUNTER — Ambulatory Visit: Payer: Self-pay

## 2015-07-02 MED ORDER — OXYCODONE-ACETAMINOPHEN 5-325 MG PO TABS: 1.0000 | ORAL_TABLET | ORAL | Status: DC | PRN

## 2015-07-02 MED ORDER — FERROUS SULFATE 325 (65 FE) MG PO TABS: 325.0000 mg | ORAL_TABLET | Freq: Two times a day (BID) | ORAL | Status: DC

## 2015-07-02 MED ORDER — IBUPROFEN 600 MG PO TABS: 600.0000 mg | ORAL_TABLET | Freq: Four times a day (QID) | ORAL | Status: DC

## 2015-07-02 NOTE — Lactation Note (Signed)
This note was copied from the chart of Jasmine Glenette Bookwalter. Lactation Consultation Note: Mother states that infant is feeding well. She denies having any pain although her breast are getting full. Mother ask for assistance with proper hand expression. Mother's milk coming in. Large amts of milk flowing when hand expressed. Mother has multiple questions about pumping. She was given a hand pump with instructions. When mother used pump large sprays of milk obtained. Mother advised in limiting use of the pacifier and cue base feed infant. Mother is aware of LC services .   Patient Name: Jasmine Ellis Date: 07/02/2015     Maternal Data    Feeding    LATCH Score/Interventions                      Lactation Tools Discussed/Used     Consult Status      Michel Bickers 07/02/2015, 4:50 PM

## 2015-07-02 NOTE — Progress Notes (Signed)
PPD #2 SVD  S:  Reports feeling well             Tolerating po/ No nausea or vomiting             Bleeding is light             Pain controlled withibuprofen (OTC)             Up ad lib / ambulatory / voiding QS  Newborn breast and bottle feeing   O:               VS: BP 103/54 mmHg  Pulse 71  Temp(Src) 97.8 F (36.6 C) (Oral)  Resp 18  Ht 5' (1.524 m)  Wt 79.833 kg (176 lb)  BMI 34.37 kg/m2  SpO2 99%  LMP 10/03/2014  Breastfeeding? Unknown   LABS:              Recent Labs  06/30/15 1514 06/30/15 1809  WBC 19.3* 19.6*  HGB 10.8* 8.8*  PLT 317 293               Blood type: --/--/AB POS (07/22 0230)  Rubella: 1.97 (12/17 1616)                                  Physical Exam:             Alert and oriented X3  Abdomen: soft, non-tender, non-distended              Fundus: firm, non-tender, U-1  Perineum: intact  Extremities: no edema, no calf pain or tenderness      A: PPD # 2  Doing well - stable status  P: Routine post partum orders  Anticipate discharge on 7/24  Luna Kitchens SNM 07/02/2015, 7:40 AM

## 2015-07-02 NOTE — Discharge Summary (Signed)
Obstetric Discharge Summary Reason for Admission: onset of labor Prenatal Procedures: ultrasound Intrapartum Procedures: spontaneous vaginal delivery Postpartum Procedures: none Complications-Operative and Postpartum: hemorrhage HEMOGLOBIN  Date Value Ref Range Status  06/30/2015 8.8* 12.0 - 15.0 g/dL Final   HCT  Date Value Ref Range Status  06/30/2015 26.3* 36.0 - 46.0 % Final    Physical Exam:  General: alert, cooperative, appears stated age and no distress Lochia: appropriate Uterine Fundus: firm Incision: healing well DVT Evaluation: No evidence of DVT seen on physical exam. Negative Homan's sign. No cords or calf tenderness.  Discharge Diagnoses: Term Pregnancy-delivered and PPH of 1500cc  Discharge Information: Date: 07/02/2015 Activity: pelvic rest Diet: routine Medications: PNV, Ibuprofen, Iron and Percocet Condition: stable and improved Instructions: refer to practice specific booklet Discharge to: home   Newborn Data: Live born female  Birth Weight: 7 lb 13.9 oz (3570 g) APGAR: 8, 9  Home with mother.  Jasmine Ellis 07/02/2015, 7:53 AM

## 2015-07-04 ENCOUNTER — Ambulatory Visit (INDEPENDENT_AMBULATORY_CARE_PROVIDER_SITE_OTHER): Payer: Medicaid Other | Admitting: Obstetrics & Gynecology

## 2015-07-04 ENCOUNTER — Encounter: Payer: Self-pay | Admitting: Obstetrics & Gynecology

## 2015-07-04 LAB — CBC
HCT: 25.2 % — ABNORMAL LOW (ref 36.0–46.0)
Hemoglobin: 8.4 g/dL — ABNORMAL LOW (ref 12.0–15.0)
MCH: 29.6 pg (ref 26.0–34.0)
MCHC: 33.3 g/dL (ref 30.0–36.0)
MCV: 88.7 fL (ref 78.0–100.0)
MPV: 9.4 fL (ref 8.6–12.4)
PLATELETS: 482 10*3/uL — AB (ref 150–400)
RBC: 2.84 MIL/uL — ABNORMAL LOW (ref 3.87–5.11)
RDW: 13.2 % (ref 11.5–15.5)
WBC: 10.5 10*3/uL (ref 4.0–10.5)

## 2015-07-04 LAB — TYPE AND SCREEN
ABO/RH(D): AB POS
Antibody Screen: NEGATIVE
Unit division: 0
Unit division: 0

## 2015-07-04 NOTE — Progress Notes (Signed)
   Subjective:    Patient ID: Jasmine Ellis, female    DOB: Nov 28, 1991, 24 y.o.   MRN: [id number]  HPI This 24 yo P2 is here with the complaint of clots 4 days after delivery. She had a NSVD 4 days ago. Her pre delivery hbg was 10.8 and post delivery was 8.8. She is taking iron pills.   Review of Systems     Objective:   Physical Exam WNWHWFNAD, pale Good fingernail bed blood flow Normal conjunctiva Minimal lochia Uterus firm, NT, and at U-4 with bimanual exam       Assessment & Plan:  Postpartum clots- check CBC

## 2015-07-06 ENCOUNTER — Encounter: Payer: Medicaid Other | Admitting: Obstetrics & Gynecology

## 2015-07-07 ENCOUNTER — Telehealth: Payer: Self-pay | Admitting: *Deleted

## 2015-07-07 DIAGNOSIS — D649 Anemia, unspecified: Secondary | ICD-10-CM

## 2015-07-07 MED ORDER — FUSION PLUS PO CAPS: ORAL_CAPSULE | ORAL | Status: DC

## 2015-07-07 NOTE — Telephone Encounter (Signed)
-----   Message from Allie Bossier, MD sent at 07/07/2015 11:02 AM EDT ----- Please prescribe Fusion Plus for her. Thanks

## 2015-07-07 NOTE — Telephone Encounter (Signed)
I have spoke with patient and let her know results and have sent in Fusion Plus.

## 2015-07-11 ENCOUNTER — Encounter: Payer: Self-pay | Admitting: *Deleted

## 2015-07-11 ENCOUNTER — Ambulatory Visit (INDEPENDENT_AMBULATORY_CARE_PROVIDER_SITE_OTHER): Payer: Medicaid Other | Admitting: Obstetrics & Gynecology

## 2015-07-11 ENCOUNTER — Encounter: Payer: Self-pay | Admitting: Obstetrics & Gynecology

## 2015-07-11 VITALS — BP 127/75 | HR 91 | Resp 16 | Ht 63.0 in | Wt 156.0 lb

## 2015-07-11 DIAGNOSIS — Z3009 Encounter for other general counseling and advice on contraception: Secondary | ICD-10-CM | POA: Diagnosis not present

## 2015-07-11 DIAGNOSIS — Z3042 Encounter for surveillance of injectable contraceptive: Secondary | ICD-10-CM

## 2015-07-11 DIAGNOSIS — Z302 Encounter for sterilization: Secondary | ICD-10-CM

## 2015-07-11 MED ORDER — MEDROXYPROGESTERONE ACETATE 150 MG/ML IM SUSP: 150.0000 mg | INTRAMUSCULAR | Status: DC

## 2015-07-11 MED ORDER — MEDROXYPROGESTERONE ACETATE 150 MG/ML IM SUSP
150.0000 mg | INTRAMUSCULAR | Status: DC
  Administered 2015-07-11: 150 mg via INTRAMUSCULAR

## 2015-07-11 NOTE — Progress Notes (Signed)
   Subjective:    Patient ID: Jasmine Ellis, female    DOB: 1991/04/12, 24 y.o.   MRN: [id number]  HPI This 24 yo P2 is here because she would like a BTL. She will need to sign Medicaid forms. She has not had sex yet. Her lochia is much less now.    Review of Systems     Objective:   Physical Exam WNWHWFNAD Abd- benign Breathing, conversing, and ambulating normally       Assessment & Plan:  Contraception- She will get a depo provera shot today and have a BTL when it is scheduled.

## 2015-07-11 NOTE — Addendum Note (Signed)
Addended by: Arne Cleveland on: 07/11/2015 12:06 PM   Modules accepted: Orders

## 2015-07-14 IMAGING — US US OB DETAIL+14 WK
1 series · 12 of 28 positions shown · non-contrast
Comparison: none

[Series 1: us ob detail+14 wk · 122 acquisitions, 12 frames shown]
[im 5/122]
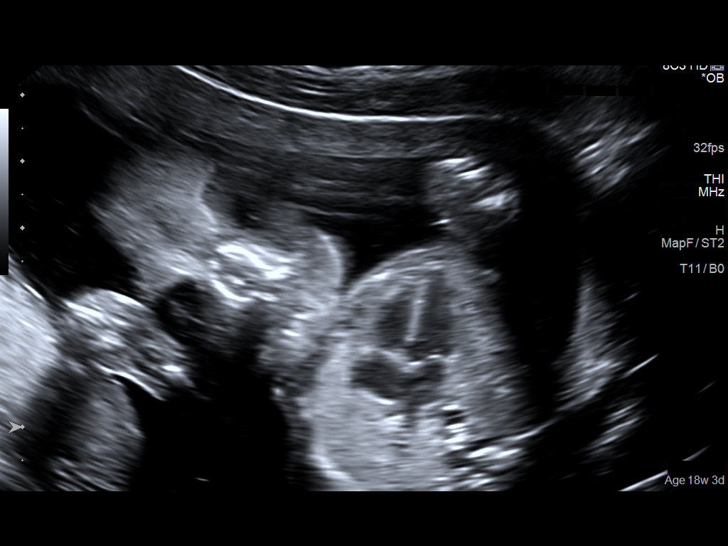
[im 14/122]
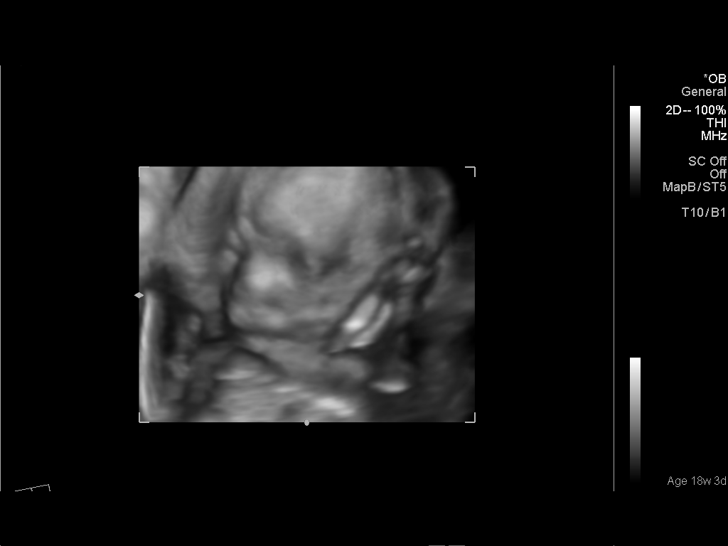
[im 23/122]
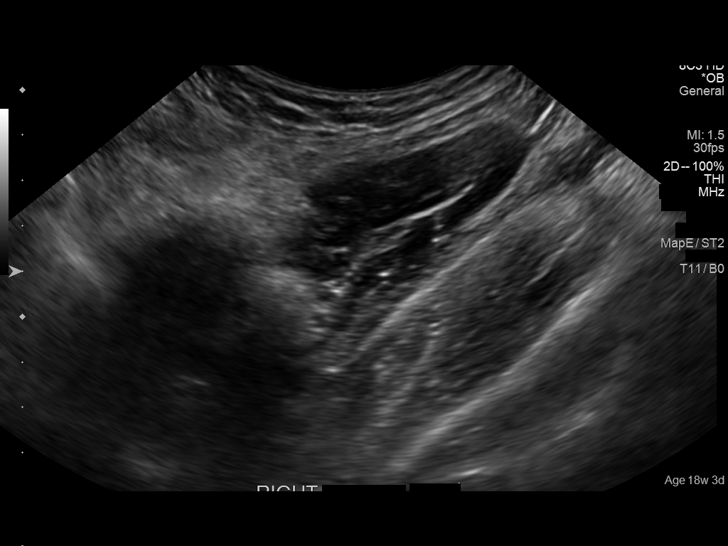
[im 36/122]
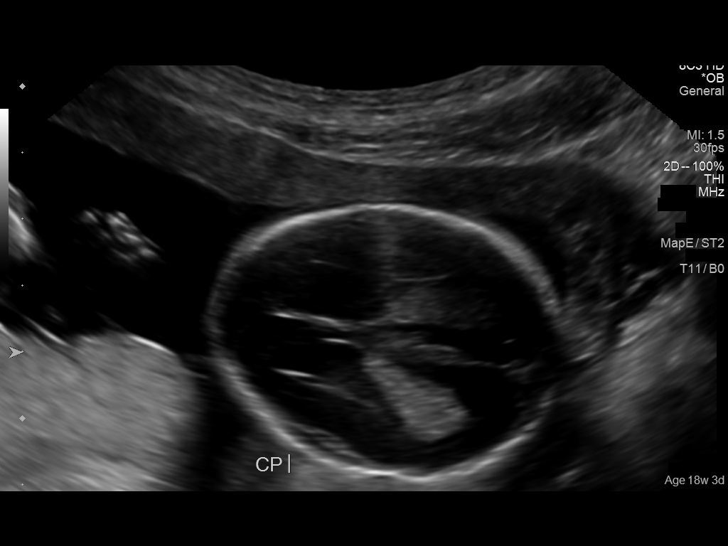
[im 45/122]
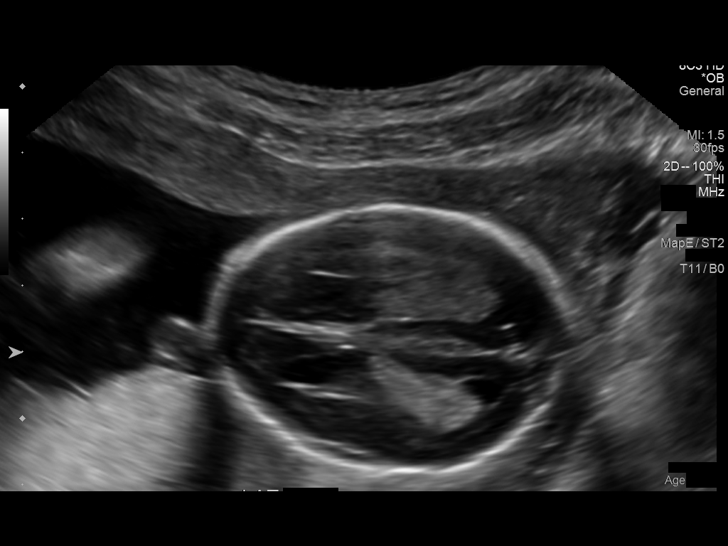
[im 54/122]
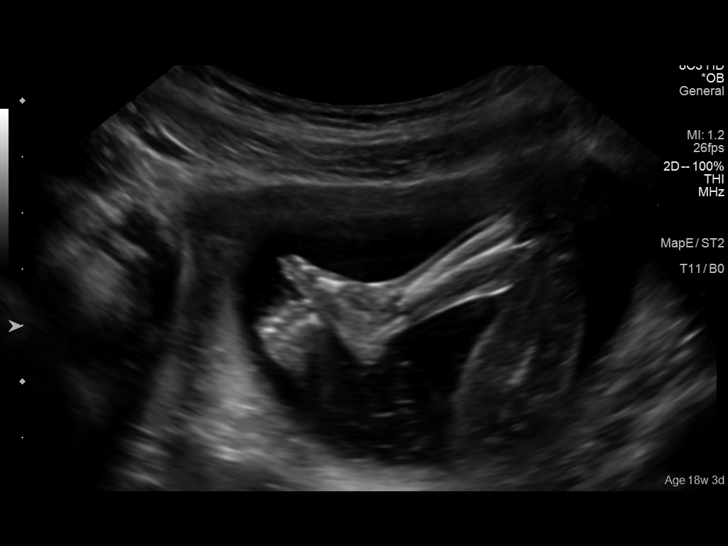
[im 68/122]
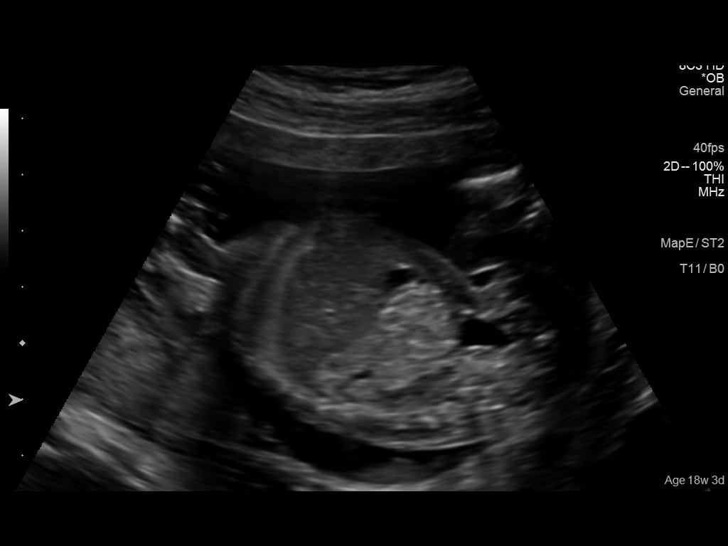
[im 77/122]
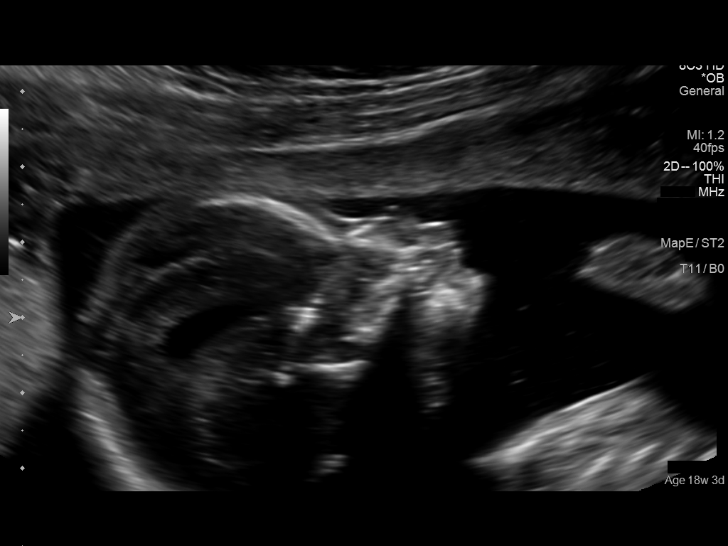
[im 86/122]
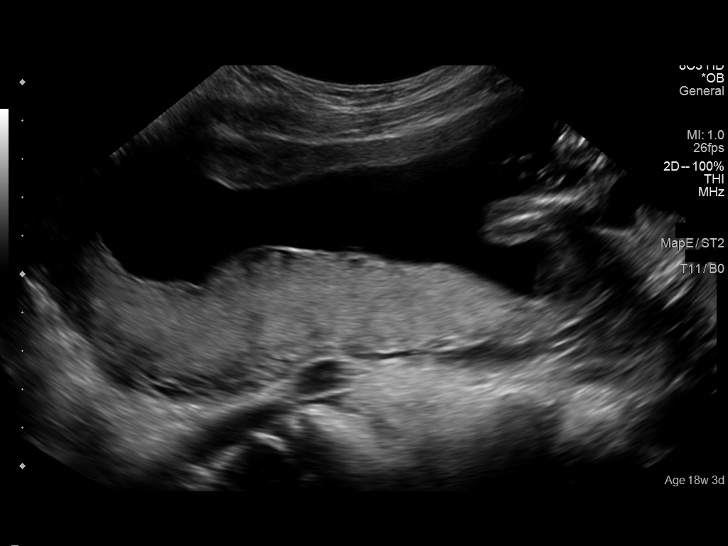
[im 99/122]
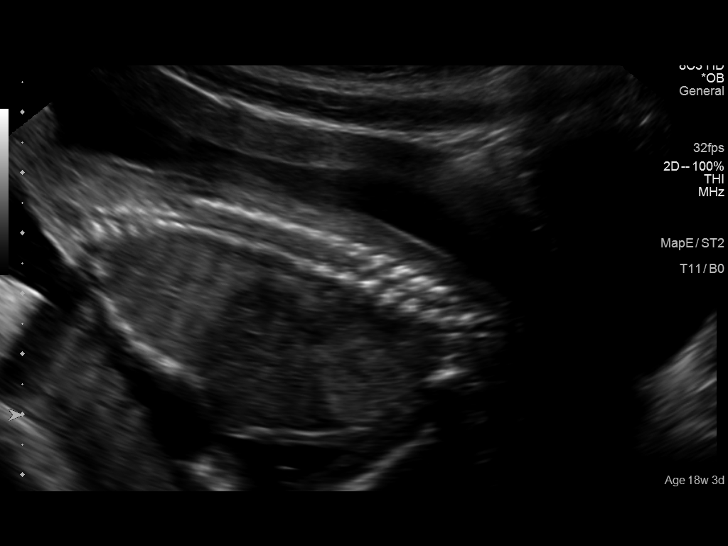
[im 108/122]
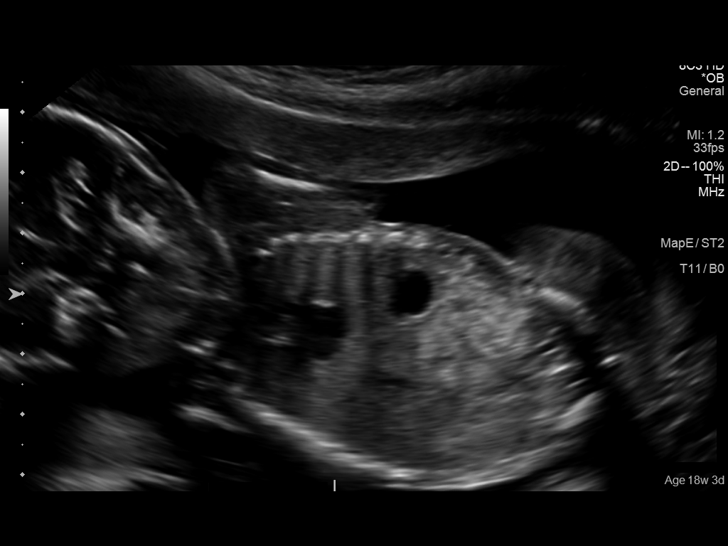
[im 117/122]
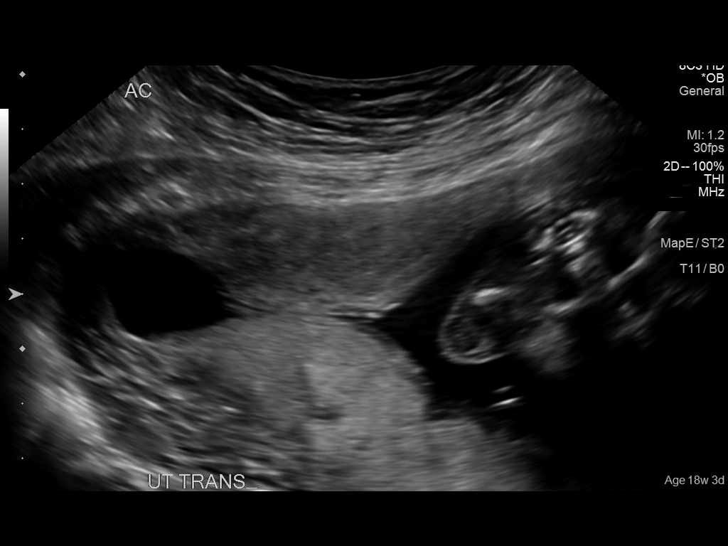

[12 of 28 positions shown; findings below may reference images not displayed]

OBSTETRICS REPORT
                      (Signed Final 02/09/2015 [DATE])

Service(s) Provided

 US OB DETAIL + 14 WK                                  76811.0
Indications

 18 weeks gestation of pregnancy
 Detailed fetal anatomic survey                        Z36
 Abnormal biochemical screen (quad) for Trisomy
 21
 Cigarette smoker
Fetal Evaluation

 Num Of Fetuses:    1
 Fetal Heart Rate:  152                          bpm
 Cardiac Activity:  Observed
 Presentation:      Variable
 Placenta:          Posterior, above cervical
                    os
 P. Cord            Visualized
 Insertion:

 Amniotic Fluid
 AFI FV:      Subjectively within normal limits
                                             Larg Pckt:     4.8  cm
Biometry

 BPD:     41.4  mm     G. Age:  18w 4d                CI:        68.93   70 - 86
                                                      FL/HC:      18.5   15.8 -
                                                                         18
 HC:     159.3  mm     G. Age:  18w 6d       60  %    HC/AC:      1.17   1.07 -

 AC:     135.9  mm     G. Age:  19w 0d       67  %    FL/BPD:
 FL:      29.4  mm     G. Age:  19w 0d       67  %    FL/AC:      21.6   20 - 24
 NFT:      3.2  mm

 Est. FW:     269  gm      0 lb 9 oz     56  %
Gestational Age

 LMP:           18w 3d        Date:  10/03/14                 EDD:   07/10/15
 U/S Today:     18w 6d                                        EDD:   07/07/15
 Best:          18w 3d     Det. By:  LMP  (10/03/14)          EDD:   07/10/15
Anatomy

 Cranium:          Appears normal         Aortic Arch:      Appears normal
 Fetal Cavum:      Appears normal         Ductal Arch:      Appears normal
 Ventricles:       Appears normal         Diaphragm:        Appears normal
 Choroid Plexus:   Appears normal         Stomach:          Appears normal, left
                                                            sided
 Cerebellum:       Appears normal         Abdomen:          Echogenic Bowel
 Posterior Fossa:  Appears normal         Abdominal Wall:   Appears nml (cord
                                                            insert, abd wall)
 Nuchal Fold:      Appears normal         Cord Vessels:     Appears normal (3
                                                            vessel cord)
 Face:             Appears normal         Kidneys:          Appear normal
                   (orbits and profile)
 Lips:             Appears normal         Bladder:          Appears normal
 Heart:            Appears normal         Spine:            Appears normal
                   (4CH, axis, and
                   situs)
 RVOT:             Appears normal         Lower             Appears normal
                                          Extremities:
 LVOT:             Appears normal         Upper             Appears normal
                                          Extremities:

 Other:  Female gender. Heels and 5th digit visualized. Technically difficult
         due to fetal position.
Cervix Uterus Adnexa

 Cervical Length:    4.1      cm

 Cervix:       Normal appearance by transabdominal scan.

 Left Ovary:    Within normal limits.
 Right Ovary:   Within normal limits.
 Adnexa:     No abnormality visualized.
Impression

 SIUP at 72w9d
 EFW 56th%
 Apparently isolated echogenic bowel
 Low risk NIPS (obtained following msQUAD with increased
 risk for T21)
 No previa

 Discussed implications of echogenic bowel.  Patient
 demonstrates adequate understanding of this marker and its
 implications.  Genetic counseling accepted.
Recommendations

 1. CMV, Toxoplasma, Parvovirus titers
 2. CF carrier screen
 3. see genetic counseling
 4. follow up interval growth in 4 weeks in [facility] (scheduled).

 questions or concerns.

## 2015-07-19 ENCOUNTER — Encounter (HOSPITAL_COMMUNITY): Payer: Self-pay | Admitting: *Deleted

## 2015-08-18 IMAGING — CR DG CHEST 2V
2 series · 2 of 2 positions shown · non-contrast
Comparison: 08/30/2006

CLINICAL DATA: Cough and congestion for 3 days. Smoker. Twenty-two
weeks pregnant and shielded.

EXAM:
CHEST  2 VIEW

[w chest pa]
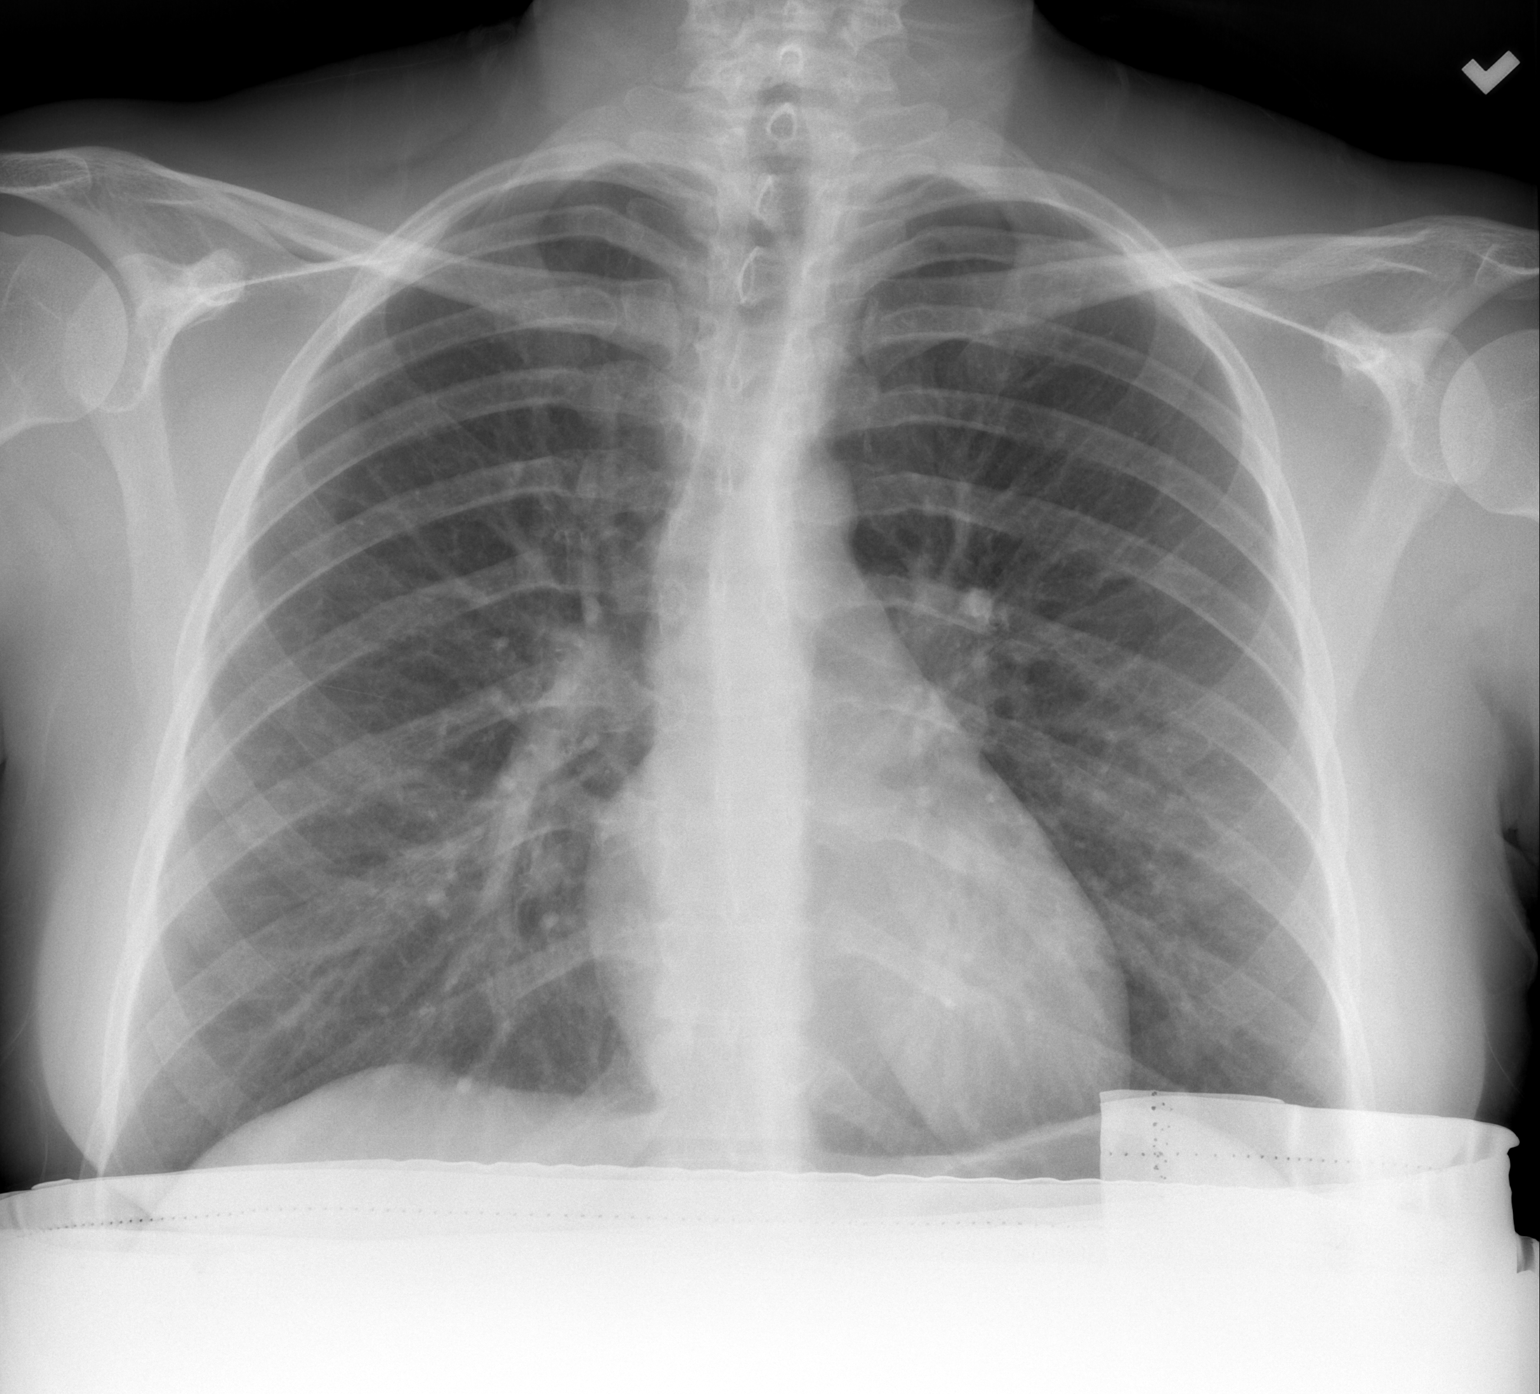

[w chest lat]
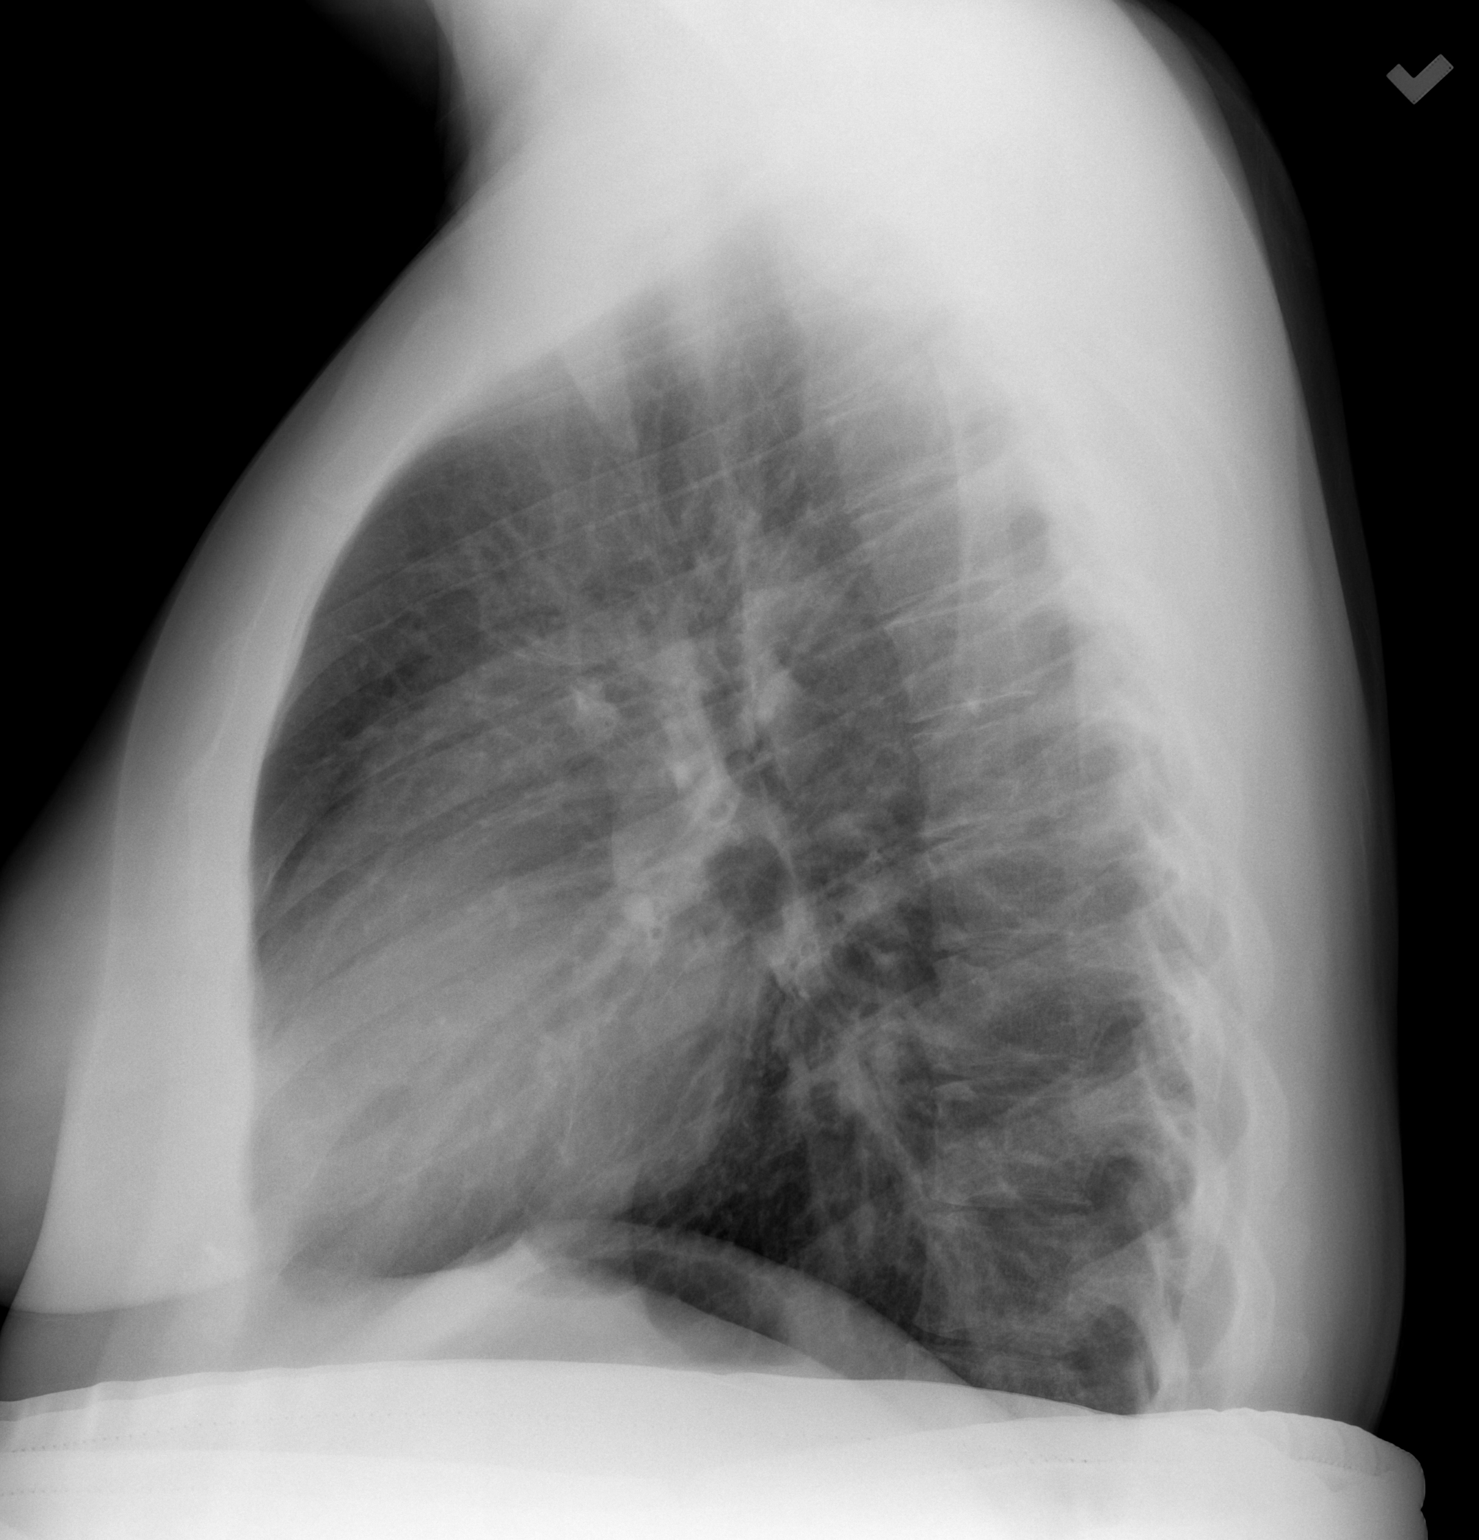

[2 of 2 positions shown; findings below may reference images not displayed]

FINDINGS: The heart size and mediastinal contours are within normal limits.
Both lungs are clear. The visualized skeletal structures are
unremarkable.
IMPRESSION: No active cardiopulmonary disease.

## 2015-09-21 NOTE — Patient Instructions (Signed)
Your procedure is scheduled on:  Monday, September 25, 2015  Enter through the Main Entrance of [facility] at:  1:30 PM  Pick up the phone at the desk and dial [phone].  Call this number if you have problems the morning of surgery: [phone].  Remember: Do NOT eat food:  AFTER MIDNIGHT SUNDAY Do NOT drink clear liquids after: AFTER 11:00 AM DAY OF SURGERY Take these medicines the morning of surgery with a SIP OF WATER:  ADDERALL, XANAX IF NEEDED  Do NOT wear jewelry (body piercing), metal hair clips/bobby pins, make-up, or nail polish. Do NOT wear lotions, powders, or perfumes.  You may wear deoderant. Do NOT shave for 48 hours prior to surgery. Do NOT bring valuables to the hospital. Contacts, dentures, or bridgework may not be worn into surgery.  Have a responsible adult drive you home and stay with you for 24 hours after your procedure

## 2015-09-22 ENCOUNTER — Encounter (HOSPITAL_COMMUNITY): Payer: Self-pay

## 2015-09-22 ENCOUNTER — Encounter (HOSPITAL_COMMUNITY)
Admission: RE | Admit: 2015-09-22 | Discharge: 2015-09-22 | Disposition: A | Discharge status: Home / Self Care | Payer: Medicaid Other | Source: Ambulatory Visit | Attending: Obstetrics & Gynecology | Admitting: Obstetrics & Gynecology

## 2015-09-22 DIAGNOSIS — Z01818 Encounter for other preprocedural examination: Secondary | ICD-10-CM | POA: Insufficient documentation

## 2015-09-22 LAB — CBC
HEMATOCRIT: 41.3 % (ref 36.0–46.0)
Hemoglobin: 13.6 g/dL (ref 12.0–15.0)
MCH: 26.8 pg (ref 26.0–34.0)
MCHC: 32.9 g/dL (ref 30.0–36.0)
MCV: 81.3 fL (ref 78.0–100.0)
PLATELETS: 300 10*3/uL (ref 150–400)
RBC: 5.08 MIL/uL (ref 3.87–5.11)
RDW: 13.3 % (ref 11.5–15.5)
WBC: 9.5 10*3/uL (ref 4.0–10.5)

## 2015-09-25 ENCOUNTER — Ambulatory Visit (HOSPITAL_COMMUNITY)
Admission: RE | Admit: 2015-09-25 | Discharge: 2015-09-25 | Disposition: A | Discharge status: Home / Self Care | Payer: Medicaid Other | Source: Ambulatory Visit | Attending: Obstetrics & Gynecology | Admitting: Obstetrics & Gynecology

## 2015-09-25 ENCOUNTER — Ambulatory Visit (HOSPITAL_COMMUNITY): Payer: Medicaid Other | Admitting: Anesthesiology

## 2015-09-25 ENCOUNTER — Encounter (HOSPITAL_COMMUNITY)
Admission: RE | Disposition: A | Discharge status: Home / Self Care | Payer: Self-pay | Source: Ambulatory Visit | Attending: Obstetrics & Gynecology

## 2015-09-25 DIAGNOSIS — F17210 Nicotine dependence, cigarettes, uncomplicated: Secondary | ICD-10-CM | POA: Diagnosis not present

## 2015-09-25 DIAGNOSIS — M419 Scoliosis, unspecified: Secondary | ICD-10-CM | POA: Diagnosis not present

## 2015-09-25 DIAGNOSIS — G43909 Migraine, unspecified, not intractable, without status migrainosus: Secondary | ICD-10-CM | POA: Insufficient documentation

## 2015-09-25 DIAGNOSIS — Z302 Encounter for sterilization: Secondary | ICD-10-CM | POA: Insufficient documentation

## 2015-09-25 DIAGNOSIS — Z881 Allergy status to other antibiotic agents status: Secondary | ICD-10-CM | POA: Insufficient documentation

## 2015-09-25 DIAGNOSIS — F419 Anxiety disorder, unspecified: Secondary | ICD-10-CM | POA: Insufficient documentation

## 2015-09-25 HISTORY — PX: LAPAROSCOPIC TUBAL LIGATION: SHX1937

## 2015-09-25 LAB — PREGNANCY, URINE: PREG TEST UR: NEGATIVE

## 2015-09-25 SURGERY — LIGATION, FALLOPIAN TUBE, LAPAROSCOPIC
Anesthesia: General

## 2015-09-25 MED ORDER — NEOSTIGMINE METHYLSULFATE 10 MG/10ML IV SOLN
INTRAVENOUS | Status: DC | PRN
  Administered 2015-09-25: 3 mg via INTRAVENOUS

## 2015-09-25 MED ORDER — PROPOFOL 10 MG/ML IV BOLUS
INTRAVENOUS | Status: AC
  Filled 2015-09-25: qty 20

## 2015-09-25 MED ORDER — LIDOCAINE HCL (CARDIAC) 20 MG/ML IV SOLN
INTRAVENOUS | Status: AC
  Filled 2015-09-25: qty 5

## 2015-09-25 MED ORDER — FENTANYL CITRATE (PF) 100 MCG/2ML IJ SOLN
25.0000 ug | INTRAMUSCULAR | Status: DC | PRN
  Administered 2015-09-25 (×3): 50 ug via INTRAVENOUS

## 2015-09-25 MED ORDER — HYDROMORPHONE HCL 1 MG/ML IJ SOLN
1.0000 mg | Freq: Once | INTRAMUSCULAR | Status: AC
Start: 2015-09-25 — End: 2015-09-25
  Administered 2015-09-25: 1 mg via INTRAVENOUS

## 2015-09-25 MED ORDER — PROPOFOL 10 MG/ML IV BOLUS
INTRAVENOUS | Status: DC | PRN
  Administered 2015-09-25: 150 mg via INTRAVENOUS

## 2015-09-25 MED ORDER — HYDROMORPHONE HCL 1 MG/ML IJ SOLN
INTRAMUSCULAR | Status: DC | PRN
  Administered 2015-09-25: 1 mg via INTRAVENOUS

## 2015-09-25 MED ORDER — DEXAMETHASONE SODIUM PHOSPHATE 10 MG/ML IJ SOLN
INTRAMUSCULAR | Status: AC
  Filled 2015-09-25: qty 1

## 2015-09-25 MED ORDER — GLYCOPYRROLATE 0.2 MG/ML IJ SOLN
INTRAMUSCULAR | Status: DC | PRN
  Administered 2015-09-25: 0.2 mg via INTRAVENOUS
  Administered 2015-09-25: 0.4 mg via INTRAVENOUS

## 2015-09-25 MED ORDER — OXYCODONE-ACETAMINOPHEN 5-325 MG PO TABS: 1.0000 | ORAL_TABLET | Freq: Four times a day (QID) | ORAL | Status: DC | PRN

## 2015-09-25 MED ORDER — ONDANSETRON HCL 4 MG/2ML IJ SOLN
INTRAMUSCULAR | Status: AC
  Filled 2015-09-25: qty 2

## 2015-09-25 MED ORDER — SCOPOLAMINE 1 MG/3DAYS TD PT72
1.0000 | MEDICATED_PATCH | Freq: Once | TRANSDERMAL | Status: DC
  Administered 2015-09-25: 1.5 mg via TRANSDERMAL

## 2015-09-25 MED ORDER — GLYCOPYRROLATE 0.2 MG/ML IJ SOLN
INTRAMUSCULAR | Status: AC
  Filled 2015-09-25: qty 3

## 2015-09-25 MED ORDER — FENTANYL CITRATE (PF) 100 MCG/2ML IJ SOLN
INTRAMUSCULAR | Status: DC | PRN
  Administered 2015-09-25: 100 ug via INTRAVENOUS
  Administered 2015-09-25: 50 ug via INTRAVENOUS
  Administered 2015-09-25: 100 ug via INTRAVENOUS

## 2015-09-25 MED ORDER — DEXAMETHASONE SODIUM PHOSPHATE 10 MG/ML IJ SOLN
INTRAMUSCULAR | Status: DC | PRN
  Administered 2015-09-25: 10 mg via INTRAVENOUS

## 2015-09-25 MED ORDER — MEPERIDINE HCL 25 MG/ML IJ SOLN
INTRAMUSCULAR | Status: AC
  Administered 2015-09-25: 12.5 mg via INTRAVENOUS
  Filled 2015-09-25: qty 1

## 2015-09-25 MED ORDER — LIDOCAINE HCL (CARDIAC) 20 MG/ML IV SOLN
INTRAVENOUS | Status: DC | PRN
  Administered 2015-09-25: 80 mg via INTRAVENOUS

## 2015-09-25 MED ORDER — MIDAZOLAM HCL 2 MG/2ML IJ SOLN
INTRAMUSCULAR | Status: DC | PRN
  Administered 2015-09-25: 2 mg via INTRAVENOUS

## 2015-09-25 MED ORDER — ONDANSETRON HCL 4 MG/2ML IJ SOLN
4.0000 mg | Freq: Once | INTRAMUSCULAR | Status: DC | PRN
Start: 2015-09-25 — End: 2015-09-25

## 2015-09-25 MED ORDER — ROCURONIUM BROMIDE 100 MG/10ML IV SOLN
INTRAVENOUS | Status: DC | PRN
  Administered 2015-09-25: 30 mg via INTRAVENOUS

## 2015-09-25 MED ORDER — FENTANYL CITRATE (PF) 100 MCG/2ML IJ SOLN
INTRAMUSCULAR | Status: AC
  Administered 2015-09-25: 50 ug via INTRAVENOUS
  Filled 2015-09-25: qty 2

## 2015-09-25 MED ORDER — ONDANSETRON HCL 4 MG/2ML IJ SOLN
INTRAMUSCULAR | Status: DC | PRN
  Administered 2015-09-25: 4 mg via INTRAVENOUS

## 2015-09-25 MED ORDER — NEOSTIGMINE METHYLSULFATE 10 MG/10ML IV SOLN
INTRAVENOUS | Status: AC
  Filled 2015-09-25: qty 1

## 2015-09-25 MED ORDER — SCOPOLAMINE 1 MG/3DAYS TD PT72
MEDICATED_PATCH | TRANSDERMAL | Status: AC
  Filled 2015-09-25: qty 1

## 2015-09-25 MED ORDER — BUPIVACAINE HCL (PF) 0.5 % IJ SOLN
INTRAMUSCULAR | Status: DC | PRN
  Administered 2015-09-25: 5 mL

## 2015-09-25 MED ORDER — BUPIVACAINE HCL (PF) 0.5 % IJ SOLN
INTRAMUSCULAR | Status: AC
  Filled 2015-09-25: qty 30

## 2015-09-25 MED ORDER — LACTATED RINGERS IV SOLN
INTRAVENOUS | Status: DC
  Administered 2015-09-25 (×2): via INTRAVENOUS

## 2015-09-25 MED ORDER — KETOROLAC TROMETHAMINE 30 MG/ML IJ SOLN
INTRAMUSCULAR | Status: AC
  Filled 2015-09-25: qty 1

## 2015-09-25 MED ORDER — FENTANYL CITRATE (PF) 250 MCG/5ML IJ SOLN
INTRAMUSCULAR | Status: AC
  Filled 2015-09-25: qty 25

## 2015-09-25 MED ORDER — MEPERIDINE HCL 25 MG/ML IJ SOLN
12.5000 mg | Freq: Once | INTRAMUSCULAR | Status: AC
  Administered 2015-09-25: 12.5 mg via INTRAVENOUS

## 2015-09-25 MED ORDER — HYDROMORPHONE HCL 1 MG/ML IJ SOLN
INTRAMUSCULAR | Status: AC
  Filled 2015-09-25: qty 1

## 2015-09-25 MED ORDER — KETOROLAC TROMETHAMINE 30 MG/ML IJ SOLN
INTRAMUSCULAR | Status: DC | PRN
  Administered 2015-09-25: 30 mg via INTRAVENOUS

## 2015-09-25 MED ORDER — ROCURONIUM BROMIDE 100 MG/10ML IV SOLN
INTRAVENOUS | Status: AC
  Filled 2015-09-25: qty 1

## 2015-09-25 MED ORDER — HYDROMORPHONE HCL 1 MG/ML IJ SOLN
INTRAMUSCULAR | Status: AC
  Administered 2015-09-25: 1 mg via INTRAVENOUS
  Filled 2015-09-25: qty 1

## 2015-09-25 MED ORDER — MIDAZOLAM HCL 2 MG/2ML IJ SOLN
INTRAMUSCULAR | Status: AC
  Filled 2015-09-25: qty 4

## 2015-09-25 SURGICAL SUPPLY — 24 items
APPLICATOR COTTON TIP 6IN STRL (MISCELLANEOUS) ×4 IMPLANT
CATH ROBINSON RED A/P 16FR (CATHETERS) ×4 IMPLANT
CLIP FILSHIE TUBAL LIGA STRL (Clip) ×3 IMPLANT
CLOTH BEACON ORANGE TIMEOUT ST (SAFETY) ×4 IMPLANT
DRSG COVADERM PLUS 2X2 (GAUZE/BANDAGES/DRESSINGS) ×5 IMPLANT
DRSG OPSITE POSTOP 3X4 (GAUZE/BANDAGES/DRESSINGS) ×3 IMPLANT
DURAPREP 26ML APPLICATOR (WOUND CARE) ×4 IMPLANT
GLOVE BIO SURGEON STRL SZ 6.5 (GLOVE) ×3 IMPLANT
GLOVE BIO SURGEONS STRL SZ 6.5 (GLOVE) ×1
GOWN STRL REUS W/TWL LRG LVL3 (GOWN DISPOSABLE) ×8 IMPLANT
NDL SAFETY ECLIPSE 18X1.5 (NEEDLE) ×2 IMPLANT
NEEDLE HYPO 18GX1.5 SHARP (NEEDLE) ×4
NEEDLE INSUFFLATION 120MM (ENDOMECHANICALS) ×4 IMPLANT
NS IRRIG 1000ML POUR BTL (IV SOLUTION) ×4 IMPLANT
PACK LAPAROSCOPY BASIN (CUSTOM PROCEDURE TRAY) ×4 IMPLANT
PAD POSITIONING PINK XL (MISCELLANEOUS) ×4 IMPLANT
SHEARS HARMONIC ACE PLUS 36CM (ENDOMECHANICALS) ×1 IMPLANT
SUT VICRYL 0 UR6 27IN ABS (SUTURE) ×4 IMPLANT
SUT VICRYL 4-0 PS2 18IN ABS (SUTURE) ×8 IMPLANT
SYR 30ML LL (SYRINGE) ×4 IMPLANT
TOWEL OR 17X24 6PK STRL BLUE (TOWEL DISPOSABLE) ×8 IMPLANT
TROCAR OPTI TIP 5M 100M (ENDOMECHANICALS) ×2 IMPLANT
TROCAR XCEL DIL TIP R 11M (ENDOMECHANICALS) ×4 IMPLANT
WARMER LAPAROSCOPE (MISCELLANEOUS) ×4 IMPLANT

## 2015-09-25 NOTE — Transfer of Care (Signed)
Immediate Anesthesia Transfer of Care Note  Patient: Jasmine Ellis  Procedure(s) Performed: Procedure(s): LAPAROSCOPIC TUBAL LIGATION with filshie clips  Patient Location: PACU  Anesthesia Type:General  Level of Consciousness: awake, alert  and oriented  Airway & Oxygen Therapy: Patient Spontanous Breathing and Patient connected to nasal cannula oxygen  Post-op Assessment: Report given to RN, Post -op Vital signs reviewed and stable and Patient moving all extremities X 4  Post vital signs: Reviewed and stable  Last Vitals:  Filed Vitals:   09/25/15 1252  BP: 125/65  Pulse: 96  Temp: 36.8 C  Resp: 18    Complications: No apparent anesthesia complications

## 2015-09-25 NOTE — Anesthesia Preprocedure Evaluation (Addendum)
Anesthesia Evaluation  Patient identified by MRN, date of birth, ID band Patient awake    Reviewed: Allergy & Precautions, NPO status , Patient's Chart, lab work & pertinent test results  History of Anesthesia Complications Negative for: history of anesthetic complications  Airway Mallampati: II  TM Distance: >3 FB Neck ROM: Full    Dental no notable dental hx. (+) Dental Advisory Given   Pulmonary Current Smoker,    Pulmonary exam normal breath sounds clear to auscultation       Cardiovascular negative cardio ROS Normal cardiovascular exam Rhythm:Regular Rate:Normal     Neuro/Psych  Headaches, Seizures -,  PSYCHIATRIC DISORDERS Anxiety    GI/Hepatic negative GI ROS, Neg liver ROS,   Endo/Other  negative endocrine ROS  Renal/GU negative Renal ROS  negative genitourinary   Musculoskeletal negative musculoskeletal ROS (+)   Abdominal   Peds negative pediatric ROS (+)  Hematology negative hematology ROS (+)   Anesthesia Other Findings   Reproductive/Obstetrics negative OB ROS                            Anesthesia Physical Anesthesia Plan  ASA: II  Anesthesia Plan: General   Post-op Pain Management:    Induction: Intravenous  Airway Management Planned: Oral ETT  Additional Equipment:   Intra-op Plan:   Post-operative Plan: Extubation in OR  Informed Consent: I have reviewed the patients History and Physical, chart, labs and discussed the procedure including the risks, benefits and alternatives for the proposed anesthesia with the patient or authorized representative who has indicated his/her understanding and acceptance.   Dental advisory given  Plan Discussed with: CRNA  Anesthesia Plan Comments:         Anesthesia Quick Evaluation

## 2015-09-25 NOTE — Discharge Instructions (Signed)
Laparoscopic Tubal Ligation, Care After Refer to this sheet in the next few weeks. These instructions provide you with information about caring for yourself after your procedure. Your health care provider may also give you more specific instructions. Your treatment has been planned according to current medical practices, but problems sometimes occur. Call your health care provider if you have any problems or questions after your procedure. WHAT TO EXPECT AFTER THE PROCEDURE After your procedure, it is common to have:  Sore throat.  Soreness at the incision site.  Mild cramping.  Tiredness.  Mild nausea or vomiting.  Shoulder pain. HOME CARE INSTRUCTIONS  Rest for the remainder of the day.  Take medicines only as directed by your health care provider. These include over-the-counter medicines and prescription medicines. Do not take aspirin, which can cause bleeding.  Over the next few days, gradually return to your normal activities and your normal diet.  Avoid sexual intercourse for 2 weeks or as directed by your health care provider.  Do not use tampons, and do not douche.  Do not drive or operate heavy machinery while taking pain medicine.  Do not lift anything that is heavier than 5 lb (2.3 kg) for 2 weeks or as directed by your health care provider.  Do not take baths. Take showers only. Ask your health care provider when you can start taking baths.  Take your temperature twice each day and write it down.  Try to have help for your household needs for the first 7-10 days.  There are many different ways to close and cover an incision, including stitches (sutures), skin glue, and adhesive strips. Follow instructions from your health care provider about:  Incision care.  Bandage (dressing) changes and removal.  Incision closure removal.  Check your incision area every day for signs of infection. Watch for:  Redness, swelling, or pain.  Fluid, blood, or pus.  Keep  all follow-up visits as directed by your health care provider. SEEK MEDICAL CARE IF:  You have redness, swelling, or increasing pain in your incision area.  You have fluid, blood, or pus coming from your incision for longer than 1 day.  You notice a bad smell coming from your incision or your dressing.  The edges of your incision break open after the sutures have been removed.  Your pain does not decrease after 2-3 days.  You have a rash.  You repeatedly become dizzy or light-headed.  You have a reaction to your medicine.  Your pain medicine is not helping.  You are constipated. SEEK IMMEDIATE MEDICAL CARE IF:  You have a fever.  You faint.  You have increasing pain in your abdomen.  You have severe pain in one or both of your shoulders.  You have bleeding or drainage from your suture sites or your vagina after surgery.  You have shortness of breath or have difficulty breathing.  You have chest pain or leg pain.  You have ongoing nausea, vomiting, or diarrhea.  DO NOT HAVE UNPROTECTED SEX UNTIL AFTER YOUR NEXT MENSTRUAL PERIOD.   This information is not intended to replace advice given to you by your health care provider. Make sure you discuss any questions you have with your health care provider.   Document Released: 06/14/2005 Document Revised: 04/11/2015 Document Reviewed: 03/07/2012 Elsevier Interactive Patient Education 2016 [employer] DISCHARGE INSTRUCTIONS: Laparoscopy  The following instructions have been prepared to help you care for yourself upon your return home today.  Wound care:  Do not get the  incision wet for the first 24 hours. The incision should be kept clean and dry.  The Band-Aids or dressings may be removed the day after surgery.  Should the incision become sore, red, and swollen after the first week, check with your doctor.  Personal hygiene:  Shower the day after your procedure.  Activity and limitations:  Do NOT drive or  operate any equipment today.  Do NOT lift anything more than 15 pounds for 2-3 weeks after surgery.  Do NOT rest in bed all day.  Walking is encouraged. Walk each day, starting slowly with 5-minute walks 3 or 4 times a day. Slowly increase the length of your walks.  Walk up and down stairs slowly.  Do NOT do strenuous activities, such as golfing, playing tennis, bowling, running, biking, weight lifting, gardening, mowing, or vacuuming for 2-4 weeks. Ask your doctor when it is okay to start.  Diet: Eat a light meal as desired this evening. You may resume your usual diet tomorrow.  Return to work: This is dependent on the type of work you do. For the most part you can return to a desk job within a week of surgery. If you are more active at work, please discuss this with your doctor.  What to expect after your surgery: You may have a slight burning sensation when you urinate on the first day. You may have a very small amount of blood in the urine. Expect to have a small amount of vaginal discharge/light bleeding for 1-2 weeks. It is not unusual to have abdominal soreness and bruising for up to 2 weeks. You may be tired and need more rest for about 1 week. You may experience shoulder pain for 24-72 hours. Lying flat in bed may relieve it.  Call your doctor for any of the following:  Develop a fever of 100.4 or greater  Inability to urinate 6 hours after discharge from hospital  Severe pain not relieved by pain medications  Persistent of heavy bleeding at incision site  Redness or swelling around incision site after a week  Increasing nausea or vomiting  Patient Signature________________________________________ Nurse Signature_________________________________________

## 2015-09-25 NOTE — H&P (Signed)
Jasmine Ellis is an 24 y.o.  P2 is here because she would like a BTL.  She has not had sex yet. Her lochia is much less now. She understands the permanence of this procedure.   No LMP recorded.    Past Medical History  Diagnosis Date  . Migraine   . Anxiety   . Scoliosis   . Seizures (HCC)     last episode 2014  . PPH (postpartum hemorrhage) 2016    Past Surgical History  Procedure Laterality Date  . Finger surgery      24 yo  . Wisdom tooth extraction      Family History  Problem Relation Age of Onset  . Hypertension Maternal Grandmother   . Drug abuse Maternal Grandfather   . Hypertension Paternal Grandmother   . Drug abuse Paternal Grandfather     Social History:  reports that she has been smoking Cigarettes.  She has a 1.25 pack-year smoking history. She has never used smokeless tobacco. She reports that she does not drink alcohol or use illicit drugs.  Allergies:  Allergies  Allergen Reactions  . Ciprofloxacin Hcl Hives    Facility-administered medications prior to admission  Medication Dose Route Frequency Provider Last Rate Last Dose  . medroxyPROGESTERone (DEPO-PROVERA) injection 150 mg  150 mg Intramuscular Q90 days Allie BossierMyra C Berma Harts, MD   150 mg at 07/11/15 1205   Prescriptions prior to admission  Medication Sig Dispense Refill Last Dose  . ALPRAZolam (XANAX) 1 MG tablet Take 1 mg by mouth as needed for anxiety.   09/22/2015  . amphetamine-dextroamphetamine (ADDERALL) 10 MG tablet Take 20 mg by mouth daily with breakfast.    09/24/2015  . ferrous sulfate 325 (65 FE) MG tablet Take 1 tablet (325 mg total) by mouth 2 (two) times daily with a meal. (Patient not taking: Reported on 09/11/2015) 60 tablet 3 Not Taking at Unknown time  . ibuprofen (ADVIL,MOTRIN) 600 MG tablet Take 1 tablet (600 mg total) by mouth every 6 (six) hours. (Patient not taking: Reported on 09/11/2015) 30 tablet 0 Not Taking at Unknown time  . Iron-FA-B Cmp-C-Biot-Probiotic (FUSION PLUS) CAPS  Take one by mouth daily (Patient not taking: Reported on 09/11/2015) 30 capsule 12 Not Taking at Unknown time  . medroxyPROGESTERone (DEPO-PROVERA) 150 MG/ML injection Inject 1 mL (150 mg total) into the muscle every 3 (three) months. (Patient not taking: Reported on 09/11/2015) 1 mL 0   . oxyCODONE-acetaminophen (PERCOCET/ROXICET) 5-325 MG per tablet Take 1 tablet by mouth every 4 (four) hours as needed (for pain scale 4-7). (Patient not taking: Reported on 09/11/2015) 30 tablet 0 Taking  . Prenat w/o A Vit-FeFum-FePo-FA (CONCEPT OB) 130-92.4-1 MG CAPS Take 1 tablet by mouth daily. (Patient not taking: Reported on 09/11/2015) 30 capsule 12 Not Taking at Unknown time    ROS  Blood pressure 125/65, pulse 96, temperature 98.2 F (36.8 C), temperature source Oral, resp. rate 18, SpO2 98 %, currently breastfeeding. Physical Exam  Heart- rrr Lungs- CTAB Abd- benign  Results for orders placed or performed during the hospital encounter of 09/25/15 (from the past 24 hour(s))  Pregnancy, urine     Status: None   Collection Time: 09/25/15 12:31 PM  Result Value Ref Range   Preg Test, Ur NEGATIVE NEGATIVE    No results found.  Assessment/Plan: She is 100% sure that she wants permanent sterility.  She understands the risks of surgery, including, but not to infection, bleeding, DVTs, damage to bowel, bladder, ureters. She wishes  to proceed. She understands the failure rate of this procedure.     Keshaun Dubey C. 09/25/2015, 1:41 PM

## 2015-09-25 NOTE — Anesthesia Procedure Notes (Signed)
Procedure Name: Intubation Date/Time: 09/25/2015 1:53 PM Performed by: Harriett RushADELOYE, Allex Lapoint A Pre-anesthesia Checklist: Patient identified, Emergency Drugs available, Suction available, Patient being monitored and Timeout performed Patient Re-evaluated:Patient Re-evaluated prior to inductionOxygen Delivery Method: Circle system utilized Preoxygenation: Pre-oxygenation with 100% oxygen Intubation Type: IV induction Ventilation: Mask ventilation without difficulty Laryngoscope Size: Miller and 2 Grade View: Grade I Tube type: Oral Tube size: 7.0 mm Number of attempts: 1 Placement Confirmation: ETT inserted through vocal cords under direct vision,  breath sounds checked- equal and bilateral and positive ETCO2 Secured at: 20 cm Tube secured with: Tape Dental Injury: Teeth and Oropharynx as per pre-operative assessment

## 2015-09-25 NOTE — Anesthesia Postprocedure Evaluation (Signed)
  Anesthesia Post-op Note  Patient: Jasmine Ellis  Procedure(s) Performed: Procedure(s): LAPAROSCOPIC TUBAL LIGATION with filshie clips  Patient Location: PACU  Anesthesia Type: General  Level of Consciousness: awake and alert   Airway and Oxygen Therapy: Patient Spontanous Breathing  Post-op Pain: mild  Post-op Assessment: Post-op Vital signs reviewed, Patient's Cardiovascular Status Stable, Respiratory Function Stable, Patent Airway and No signs of Nausea or vomiting  Last Vitals:  Filed Vitals:   09/25/15 1545  BP: 127/90  Pulse: 63  Temp:   Resp: 19    Post-op Vital Signs: stable   Complications: No apparent anesthesia complications

## 2015-09-25 NOTE — Op Note (Signed)
09/25/2015  2:26 PM  PATIENT:  Jasmine Ellis  24 y.o. female  PRE-OPERATIVE DIAGNOSIS:  cpt 58700 - Undesired fertility  POST-OPERATIVE DIAGNOSIS:  cpt 58700 - Undesired fertility  PROCEDURE:  Procedure(s): LAPAROSCOPIC TUBAL LIGATION with filshie clips  SURGEON:  Surgeon(s) and Role:    * Allie BossierMyra C Antwann Preziosi, MD - Primary  ANESTHESIA:   general  EBL:  Total I/O In: 500 [I.V.:500] Out: -   BLOOD ADMINISTERED:none  DRAINS: none   LOCAL MEDICATIONS USED:  MARCAINE     SPECIMEN:  No Specimen  DISPOSITION OF SPECIMEN:  N/A  COUNTS:  YES  TOURNIQUET:  * No tourniquets in log *  DICTATION: .Dragon Dictation  PLAN OF CARE: Discharge to home after PACU  PATIENT DISPOSITION:  PACU - hemodynamically stable.   Delay start of Pharmacological VTE agent (>24hrs) due to surgical blood loss or risk of bleeding: not applicable   The risks, benefits, alternatives of surgery were explained understood, accepted. All questions were answered. She understands the failure rate of 1/200. She declines alternative forms of birth control. Her urine pregnancy test was negative. She was taken to the operating room and general anesthesia was applied without complication. She was placed in dorsal lithotomy position. Her abdomen and vagina were prepped and draped in the usual sterile fashion. A time out procedure was done. A bimanual exam revealed a normal, size and shape mobile uterus with nonenlarged adnexa. A Hulka manipulator was placed on the cervix. Gloves were changed and attention was turned to the abdomen. Approximately 5 mL of 0.5% Marcaine was used to infiltrate the subcutaneous tissue at the umbilicus. A vertical 1 cm incision was made. A Veres needle was placed in the pelvis. Low-flow CO2 was used to insufflate the abdomen to approximately 3 L. After good pneumoperitoneum was established, a 11 mm trocar was placed. Laparoscopy confirmed correct placement. She was placed in the Trendelenburg  position. Patient abdominal pressure was always less than 15. Her uterus ovaries and tubes appeared normal.  The oviducts were visually traced to their fimbriated end on each side. In the isthmic region of each oviduct, a Filshie clip was placed across the entire oviduct. There was no bleeding. The CO2 was allowed to escape from her abdomen. The umbilical fascia was closed with a figure of 0 Vicryl suture. No defects were palpable. The subcuticular closure was done with 4-0 Vicryl suture. The Hulka manipulator was removed. She was extubated and taken to recovery in stable condition. She tolerated the procedure well. The instrument, sponge, and needle counts were correct.

## 2015-09-26 ENCOUNTER — Encounter (HOSPITAL_COMMUNITY): Payer: Self-pay | Admitting: Obstetrics & Gynecology

## 2015-10-26 ENCOUNTER — Telehealth: Payer: Self-pay | Admitting: *Deleted

## 2015-10-26 DIAGNOSIS — B379 Candidiasis, unspecified: Secondary | ICD-10-CM

## 2015-10-26 MED ORDER — FLUCONAZOLE 150 MG PO TABS: 150.0000 mg | ORAL_TABLET | Freq: Once | ORAL | Status: DC

## 2015-10-26 NOTE — Telephone Encounter (Signed)
-----   Message from Olevia BowensJacinda S Battle sent at 10/26/2015 10:11 AM EST ----- Regarding: Rx Request Contact: [phone] Patient called and states she has symptoms of a yeast infection, wants to know if she could have something called in. Also she states that she has vaginal tear, wants to know if there is anything she could have for that. [facility]ses [facility] [address]

## 2015-10-26 NOTE — Telephone Encounter (Signed)
I have sent patient in Diflucan for her yeast infection.  She says the vaginal area is red and inflammed and has a heavy thick discharge. Patient scheduled an appt tomorrow because she said she has a tear in her labia and would like it looked at.

## 2015-10-27 ENCOUNTER — Ambulatory Visit: Payer: Medicaid Other | Admitting: Family Medicine

## 2015-10-30 ENCOUNTER — Ambulatory Visit (INDEPENDENT_AMBULATORY_CARE_PROVIDER_SITE_OTHER): Payer: Medicaid Other | Admitting: Obstetrics & Gynecology

## 2015-10-30 ENCOUNTER — Encounter: Payer: Self-pay | Admitting: Obstetrics & Gynecology

## 2015-10-30 VITALS — BP 133/85 | HR 89 | Wt 160.0 lb

## 2015-10-30 DIAGNOSIS — N9089 Other specified noninflammatory disorders of vulva and perineum: Secondary | ICD-10-CM | POA: Diagnosis not present

## 2015-10-30 DIAGNOSIS — Z113 Encounter for screening for infections with a predominantly sexual mode of transmission: Secondary | ICD-10-CM | POA: Diagnosis not present
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
## 2015-10-31 LAB — HSV 2 ANTIBODY, IGG: HSV 2 Glycoprotein G Ab, IgG: <0.1 IV

## 2015-11-08 ENCOUNTER — Encounter (HOSPITAL_COMMUNITY): Payer: Self-pay | Admitting: *Deleted

## 2015-11-10 ENCOUNTER — Encounter: Payer: Medicaid Other | Admitting: Obstetrics & Gynecology

## 2017-01-01 ENCOUNTER — Emergency Department (HOSPITAL_COMMUNITY)
Admission: EM | Admit: 2017-01-01 | Discharge: 2017-01-01 | Disposition: A | Discharge status: Home / Self Care | Payer: Medicaid Other | Attending: Emergency Medicine | Admitting: Emergency Medicine

## 2017-01-01 ENCOUNTER — Encounter (HOSPITAL_COMMUNITY): Payer: Self-pay | Admitting: Emergency Medicine

## 2017-01-01 DIAGNOSIS — R569 Unspecified convulsions: Secondary | ICD-10-CM | POA: Insufficient documentation

## 2017-01-01 DIAGNOSIS — F17210 Nicotine dependence, cigarettes, uncomplicated: Secondary | ICD-10-CM | POA: Insufficient documentation

## 2017-01-01 LAB — CBC WITH DIFFERENTIAL/PLATELET
BASOS ABS: 0 10*3/uL (ref 0.0–0.1)
BASOS PCT: 0 %
EOS ABS: 0.5 10*3/uL (ref 0.0–0.7)
EOS PCT: 6 %
HCT: 40 % (ref 36.0–46.0)
Hemoglobin: 13.4 g/dL (ref 12.0–15.0)
Lymphocytes Relative: 30 %
Lymphs Abs: 2.4 10*3/uL (ref 0.7–4.0)
MCH: 28.4 pg (ref 26.0–34.0)
MCHC: 33.5 g/dL (ref 30.0–36.0)
MCV: 84.7 fL (ref 78.0–100.0)
MONO ABS: 0.5 10*3/uL (ref 0.1–1.0)
Monocytes Relative: 7 %
Neutro Abs: 4.6 10*3/uL (ref 1.7–7.7)
Neutrophils Relative %: 57 %
PLATELETS: 253 10*3/uL (ref 150–400)
RBC: 4.72 MIL/uL (ref 3.87–5.11)
RDW: 12.1 % (ref 11.5–15.5)
WBC: 8 10*3/uL (ref 4.0–10.5)

## 2017-01-01 LAB — BASIC METABOLIC PANEL
Anion gap: 8 (ref 5–15)
BUN: 11 mg/dL (ref 6–20)
CO2: 27 mmol/L (ref 22–32)
Calcium: 8.9 mg/dL (ref 8.9–10.3)
Chloride: 104 mmol/L (ref 101–111)
Creatinine, Ser: 0.74 mg/dL (ref 0.44–1.00)
GFR calc Af Amer: >60 mL/min (ref >60)
GFR calc non Af Amer: >60 mL/min (ref >60)
Glucose, Bld: 109 mg/dL — ABNORMAL HIGH (ref 65–99)
Potassium: 4 mmol/L (ref 3.5–5.1)
Sodium: 139 mmol/L (ref 135–145)

## 2017-01-01 LAB — I-STAT BETA HCG BLOOD, ED (MC, WL, AP ONLY): I-stat hCG, quantitative: <5 m[IU]/mL (ref <5)

## 2017-01-01 LAB — CBG MONITORING, ED: Glucose-Capillary: 96 mg/dL (ref 65–99)

## 2017-01-01 MED ORDER — LEVETIRACETAM 500 MG/5ML IV SOLN
1000.0000 mg | Freq: Once | INTRAVENOUS | Status: AC
  Administered 2017-01-01: 1000 mg via INTRAVENOUS
  Filled 2017-01-01: qty 10

## 2017-01-01 MED ORDER — LEVETIRACETAM 500 MG PO TABS: 500.0000 mg | ORAL_TABLET | Freq: Two times a day (BID) | ORAL | 0 refills | Status: DC

## 2017-01-01 MED ORDER — LORAZEPAM 1 MG PO TABS: 1.0000 mg | ORAL_TABLET | Freq: Once | ORAL | Status: DC

## 2017-01-01 MED ORDER — LORAZEPAM 2 MG/ML IJ SOLN
1.0000 mg | Freq: Once | INTRAMUSCULAR | Status: AC
  Administered 2017-01-01: 1 mg via INTRAVENOUS
  Filled 2017-01-01: qty 1

## 2017-01-01 NOTE — ED Triage Notes (Signed)
Patient states that she had a seizure about 30 minutes ago. Patient is not on any medications and the last seizure was two years ago.

## 2017-01-01 NOTE — ED Notes (Signed)
Bed: WA05 Expected date:  Expected time:  Means of arrival:  Comments: 

## 2017-01-01 NOTE — Discharge Instructions (Signed)
You were seen today for seizure. He will be started on Keppra. Follow-up with neurology. Do not drive or operate heavy machinery until cleared by neurology.

## 2017-01-01 NOTE — ED Provider Notes (Signed)
[facility]-EMERGENCY DEPT Provider Note   CSN: [id number] Arrival date & time: 01/01/17  0400     History   Chief Complaint Chief Complaint  Patient presents with  . Seizures    HPI Jasmine Ellis is a 26 y.o. female.  HPI  This is a 26 year old female with a history of seizures who presents with seizure-like  Activity.  Patient was previously on Topamax. However, she has not been on any seizure medications in over one year. Reports that her last seizure was over one year ago. She is lying in bed when her partner noticed that she went stiff and started to shake. She was unresponsive for approximately 2 minutes. Following this she was confused. Currently patient states she just feels "groggy." Denies any headaches or fevers recently. Reports that she recently has lost her insurance and has not been able to see a doctor.  Past Medical History:  Diagnosis Date  . Anxiety   . Migraine   . PPH (postpartum hemorrhage) 2016  . Scoliosis   . Seizures (HCC)    last episode 2014    Patient Active Problem List   Diagnosis Date Noted  . Encounter for female sterilization procedure 09/25/2015  . Normal labor 06/30/2015  . Cigarette smoker   . Echogenic bowel of fetus on prenatal ultrasound   . Tobacco smoking affecting pregnancy   . Supervision of normal pregnancy 12/21/2014  . 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 FINGER SURGERY     26 yo  . LAPAROSCOPIC TUBAL LIGATION  09/25/2015   Procedure: LAPAROSCOPIC TUBAL LIGATION with filshie clips;  Surgeon: Allie BossierMyra C Dove, MD;  Location: [facility];  Service: Gynecology;;  . [place] HavenWISDOM TOOTH EXTRACTION      OB History    Gravida Para Term Preterm AB Living   2 2 2     2    SAB TAB Ectopic Multiple Live Births         0 2       Home Medications    Prior to Admission medications   Medication Sig Start Date End Date Taking? Authorizing Provider  ALPRAZolam Prudy Feeler(XANAX) 1  MG tablet Take 1 mg by mouth as needed for anxiety.   Yes Historical Provider, MD  amphetamine-dextroamphetamine (ADDERALL) 10 MG tablet Take 20 mg by mouth daily with breakfast.    Yes Historical Provider, MD    Family History Family History  Problem Relation Age of Onset  . Hypertension Maternal Grandmother   . Drug abuse Maternal Grandfather   . Hypertension Paternal Grandmother   . Drug abuse Paternal Grandfather     Social History Social History  Substance Use Topics  . Smoking status: Current Every Day Smoker    Packs/day: 0.25    Years: 5.00    Types: Cigarettes  . Smokeless tobacco: Never Used  . Alcohol use No     Allergies   Ciprofloxacin hcl   Review of Systems Review of Systems  Constitutional: Negative for fever.  Respiratory: Negative for shortness of breath.   Cardiovascular: Negative for chest pain.  Genitourinary: Negative for dysuria.  Neurological: Positive for seizures. Negative for headaches.  All other systems reviewed and are negative.    Physical Exam Updated Vital Signs BP 116/82 (BP Location: Right Arm)   Pulse 109   Temp 98.3 F (36.8 C) (Oral)   Resp 18   Ht 5\' 5"  (1.651 m)   Wt 160 lb (72.6  kg)   SpO2 96%   BMI 26.63 kg/m   Physical Exam  Constitutional: She is oriented to person, place, and time. She appears well-developed and well-nourished. No distress.  HENT:  Head: Normocephalic and atraumatic.  Cardiovascular: Normal rate, regular rhythm and normal heart sounds.   No murmur heard. Pulmonary/Chest: Effort normal and breath sounds normal. No respiratory distress. She has no wheezes.  Abdominal: Soft. There is no tenderness.  Neurological: She is alert and oriented to person, place, and time.  Cranial nerves II-12 intact, 5 out of 5 strength in all 4 charities, no dysmetria to finger-nose-finger  Skin: Skin is warm and dry.  Psychiatric: She has a normal mood and affect.  Nursing note and vitals reviewed.    ED  Treatments / Results  Labs (all labs ordered are listed, but only abnormal results are displayed) Labs Reviewed  BASIC METABOLIC PANEL - Abnormal; Notable for the following:       Result Value   Glucose, Bld 109 (*)    All other components within normal limits  CBC WITH DIFFERENTIAL/PLATELET  CBG MONITORING, ED  I-STAT BETA HCG BLOOD, ED ([facility], [facility], [facility] ONLY)    EKG  EKG Interpretation None       Radiology No results found.  Procedures Procedures (including critical care time)  Medications Ordered in ED Medications  levETIRAcetam (KEPPRA) 1,000 mg in sodium chloride 0.9 % 100 mL IVPB (not administered)  LORazepam (ATIVAN) injection 1 mg (1 mg Intravenous Given 01/01/17 0539)     Initial Impression / Assessment and Plan / ED Course  I have reviewed the triage vital signs and the nursing notes.  Pertinent labs & imaging results that were available during my care of the patient were reviewed by me and considered in my medical decision making (see chart for details).     Patient presents with likely recurrent seizure. History of the same. Not currently on medication. Patient was given IV Ativan. Basic labwork obtained and reassuring. Discussed with Dr. Roseanne Reno. He recommends initiating Keppra with a Keppra load and starting Keppra 500 mg twice a day. Patient will be given follow-up with neurology.  After history, exam, and medical workup I feel the patient has been appropriately medically screened and is safe for discharge home. Pertinent diagnoses were discussed with the patient. Patient was given return precautions.   Final Clinical Impressions(s) / ED Diagnoses   Final diagnoses:  Seizure [facility])    New Prescriptions New Prescriptions   No medications on file     Shon Baton, MD 01/01/17 [phone]

## 2017-01-01 NOTE — ED Notes (Signed)
Bed: WTR7 Expected date:  Expected time:  Means of arrival:  Comments: 

## 2017-03-07 ENCOUNTER — Emergency Department (HOSPITAL_BASED_OUTPATIENT_CLINIC_OR_DEPARTMENT_OTHER)
Admission: EM | Admit: 2017-03-07 | Discharge: 2017-03-07 | Disposition: A | Discharge status: Home / Self Care | Payer: Medicaid Other | Attending: Emergency Medicine | Admitting: Emergency Medicine

## 2017-03-07 ENCOUNTER — Encounter (HOSPITAL_BASED_OUTPATIENT_CLINIC_OR_DEPARTMENT_OTHER): Payer: Self-pay | Admitting: *Deleted

## 2017-03-07 DIAGNOSIS — S0993XA Unspecified injury of face, initial encounter: Secondary | ICD-10-CM | POA: Diagnosis present

## 2017-03-07 DIAGNOSIS — S0083XA Contusion of other part of head, initial encounter: Secondary | ICD-10-CM

## 2017-03-07 DIAGNOSIS — M62830 Muscle spasm of back: Secondary | ICD-10-CM | POA: Diagnosis not present

## 2017-03-07 DIAGNOSIS — F17210 Nicotine dependence, cigarettes, uncomplicated: Secondary | ICD-10-CM | POA: Diagnosis not present

## 2017-03-07 DIAGNOSIS — Y929 Unspecified place or not applicable: Secondary | ICD-10-CM | POA: Insufficient documentation

## 2017-03-07 DIAGNOSIS — Y999 Unspecified external cause status: Secondary | ICD-10-CM | POA: Insufficient documentation

## 2017-03-07 DIAGNOSIS — Y9389 Activity, other specified: Secondary | ICD-10-CM | POA: Insufficient documentation

## 2017-03-07 MED ORDER — NAPROXEN 500 MG PO TABS: 500.0000 mg | ORAL_TABLET | Freq: Two times a day (BID) | ORAL | 0 refills | Status: DC

## 2017-03-07 MED ORDER — METHOCARBAMOL 750 MG PO TABS: 750.0000 mg | ORAL_TABLET | Freq: Three times a day (TID) | ORAL | 0 refills | Status: DC | PRN

## 2017-03-07 MED ORDER — METHOCARBAMOL 500 MG PO TABS
750.0000 mg | ORAL_TABLET | Freq: Once | ORAL | Status: AC
  Administered 2017-03-07: 750 mg via ORAL
  Filled 2017-03-07: qty 2

## 2017-03-07 MED ORDER — IBUPROFEN 800 MG PO TABS
800.0000 mg | ORAL_TABLET | Freq: Once | ORAL | Status: AC
  Administered 2017-03-07: 800 mg via ORAL
  Filled 2017-03-07: qty 1
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
## 2017-03-07 NOTE — ED Notes (Signed)
Pt discharged to home with family. NAD.  
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
## 2017-07-27 ENCOUNTER — Encounter (HOSPITAL_BASED_OUTPATIENT_CLINIC_OR_DEPARTMENT_OTHER): Payer: Self-pay | Admitting: Emergency Medicine

## 2017-07-27 ENCOUNTER — Emergency Department (HOSPITAL_BASED_OUTPATIENT_CLINIC_OR_DEPARTMENT_OTHER)
Admission: EM | Admit: 2017-07-27 | Discharge: 2017-07-27 | Disposition: A | Discharge status: Home / Self Care | Payer: Medicaid Other | Attending: Emergency Medicine | Admitting: Emergency Medicine

## 2017-07-27 DIAGNOSIS — Z79899 Other long term (current) drug therapy: Secondary | ICD-10-CM | POA: Diagnosis not present

## 2017-07-27 DIAGNOSIS — R102 Pelvic and perineal pain: Secondary | ICD-10-CM | POA: Diagnosis not present

## 2017-07-27 DIAGNOSIS — N390 Urinary tract infection, site not specified: Secondary | ICD-10-CM | POA: Insufficient documentation

## 2017-07-27 DIAGNOSIS — F17210 Nicotine dependence, cigarettes, uncomplicated: Secondary | ICD-10-CM | POA: Insufficient documentation

## 2017-07-27 LAB — URINALYSIS, ROUTINE W REFLEX MICROSCOPIC
Bilirubin Urine: NEGATIVE
Glucose, UA: NEGATIVE mg/dL
Hgb urine dipstick: NEGATIVE
KETONES UR: 15 mg/dL — AB
NITRITE: NEGATIVE
PH: 7.5 (ref 5.0–8.0)
Protein, ur: 30 mg/dL — AB
SPECIFIC GRAVITY, URINE: 1.028 (ref 1.005–1.030)

## 2017-07-27 LAB — PREGNANCY, URINE: Preg Test, Ur: NEGATIVE

## 2017-07-27 LAB — URINALYSIS, MICROSCOPIC (REFLEX)

## 2017-07-27 LAB — WET PREP, GENITAL
CLUE CELLS WET PREP: NONE SEEN
SPERM: NONE SEEN
TRICH WET PREP: NONE SEEN
YEAST WET PREP: NONE SEEN

## 2017-07-27 MED ORDER — NITROFURANTOIN MONOHYD MACRO 100 MG PO CAPS: 100.0000 mg | ORAL_CAPSULE | Freq: Two times a day (BID) | ORAL | 0 refills | Status: DC

## 2017-07-27 MED ORDER — CEFTRIAXONE SODIUM 250 MG IJ SOLR
250.0000 mg | Freq: Once | INTRAMUSCULAR | Status: AC
Start: 2017-07-27 — End: 2017-07-27
  Administered 2017-07-27: 250 mg via INTRAMUSCULAR
  Filled 2017-07-27: qty 250

## 2017-07-27 MED ORDER — IBUPROFEN 400 MG PO TABS: 400.0000 mg | ORAL_TABLET | Freq: Four times a day (QID) | ORAL | 0 refills | Status: DC | PRN

## 2017-07-27 MED ORDER — KETOROLAC TROMETHAMINE 60 MG/2ML IM SOLN
60.0000 mg | Freq: Once | INTRAMUSCULAR | Status: AC
  Administered 2017-07-27: 60 mg via INTRAMUSCULAR
  Filled 2017-07-27: qty 2

## 2017-07-27 MED ORDER — DOXYCYCLINE HYCLATE 100 MG PO CAPS: 100.0000 mg | ORAL_CAPSULE | Freq: Two times a day (BID) | ORAL | 0 refills | Status: AC

## 2017-07-27 NOTE — ED Triage Notes (Signed)
ptient states that for about a month she has had the symptoms of a UTI  - reports that for the last 2 -3 days she has started to have lower pelvic pain with pain to her back
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
## 2017-07-28 LAB — GC/CHLAMYDIA PROBE AMP (~~LOC~~) NOT AT ARMC
CHLAMYDIA, DNA PROBE: NEGATIVE
NEISSERIA GONORRHEA: NEGATIVE

## 2017-07-29 LAB — URINE CULTURE

## 2021-05-22 ENCOUNTER — Emergency Department (HOSPITAL_BASED_OUTPATIENT_CLINIC_OR_DEPARTMENT_OTHER): Payer: Medicaid Other

## 2021-05-22 ENCOUNTER — Other Ambulatory Visit: Payer: Self-pay

## 2021-05-22 ENCOUNTER — Emergency Department (HOSPITAL_BASED_OUTPATIENT_CLINIC_OR_DEPARTMENT_OTHER)
Admission: EM | Admit: 2021-05-22 | Discharge: 2021-05-22 | Disposition: A | Discharge status: Home / Self Care | Payer: Medicaid Other | Attending: Emergency Medicine | Admitting: Emergency Medicine

## 2021-05-22 ENCOUNTER — Encounter (HOSPITAL_BASED_OUTPATIENT_CLINIC_OR_DEPARTMENT_OTHER): Payer: Self-pay | Admitting: *Deleted

## 2021-05-22 DIAGNOSIS — F17210 Nicotine dependence, cigarettes, uncomplicated: Secondary | ICD-10-CM | POA: Diagnosis not present

## 2021-05-22 DIAGNOSIS — J690 Pneumonitis due to inhalation of food and vomit: Secondary | ICD-10-CM | POA: Diagnosis not present

## 2021-05-22 DIAGNOSIS — R079 Chest pain, unspecified: Secondary | ICD-10-CM | POA: Diagnosis present

## 2021-05-22 DIAGNOSIS — Z20822 Contact with and (suspected) exposure to covid-19: Secondary | ICD-10-CM | POA: Diagnosis not present

## 2021-05-22 LAB — RESP PANEL BY RT-PCR (FLU A&B, COVID) ARPGX2
Influenza A by PCR: NEGATIVE
Influenza B by PCR: NEGATIVE
SARS Coronavirus 2 by RT PCR: NEGATIVE

## 2021-05-22 MED ORDER — ACETAMINOPHEN 325 MG PO TABS
650.0000 mg | ORAL_TABLET | Freq: Once | ORAL | Status: AC
  Administered 2021-05-22: 18:00:00 650 mg via ORAL
  Filled 2021-05-22: qty 2

## 2021-05-22 MED ORDER — AMOXICILLIN-POT CLAVULANATE 875-125 MG PO TABS: 1.0000 | ORAL_TABLET | Freq: Two times a day (BID) | ORAL | 0 refills | Status: AC

## 2021-05-22 NOTE — ED Provider Notes (Signed)
[facility] EMERGENCY DEPARTMENT Provider Note   CSN: [id number] Arrival date & time: 05/22/21  1710     History Chief Complaint  Patient presents with   Chest Injury    Jasmine Ellis is a 30 y.o. female.  HPI 30 year old female with a history of seizures, anxiety, migraines presents to the ER with complaints of central chest pain after having overdosed on heroin, getting Narcan, and having CPR done on her by her husband.  She has pain in the center of her chest which is worsened with walking, patient also endorses some shortness of breath with exertion.  She is tearful and here in the ER, states that she thinks she may have the flu "or something".  She denies any dizziness, leg swelling, not on OCPs.    Past Medical History:  Diagnosis Date   Anxiety    Migraine    PPH (postpartum hemorrhage) 2016   Scoliosis    Seizures (HCC)    last episode 2014    Patient Active Problem List   Diagnosis Date Noted   Encounter for female sterilization procedure 09/25/2015   Normal labor 06/30/2015   Cigarette smoker    Echogenic bowel of fetus on prenatal ultrasound    Tobacco smoking affecting pregnancy    Supervision of normal pregnancy 12/21/2014   Epilepsy affecting pregnancy, antepartum (HCC) 12/21/2014    Past Surgical History:  Procedure Laterality Date   FINGER SURGERY     30 yo   LAPAROSCOPIC TUBAL LIGATION  09/25/2015   Procedure: LAPAROSCOPIC TUBAL LIGATION with filshie clips;  Surgeon: Allie Bossier, MD;  Location: [facility];  Service: Gynecology;;   WISDOM TOOTH EXTRACTION       OB History     Gravida  2   Para  2   Term  2   Preterm      AB      Living  2      SAB      IAB      Ectopic      Multiple  0   Live Births  2           Family History  Problem Relation Age of Onset   Hypertension Maternal Grandmother    Drug abuse Maternal Grandfather    Hypertension Paternal Grandmother    Drug abuse Paternal Grandfather      Social History   Tobacco Use   Smoking status: Every Day    Packs/day: 0.25    Years: 5.00    Pack years: 1.25    Types: Cigarettes   Smokeless tobacco: Never  Substance Use Topics   Alcohol use: No   Drug use: No    Comment: heroine    Home Medications Prior to Admission medications   Medication Sig Start Date End Date Taking? Authorizing Provider  amphetamine-dextroamphetamine (ADDERALL XR) 30 MG 24 hr capsule  02/12/21  Yes [provider]  brexpiprazole (REXULTI) 2 MG TABS tablet Take by mouth. 01/24/21  Yes [provider]  Buprenorphine HCl-Naloxone HCl 8-2 MG FILM DISSOLVE 1 FILM BY MOUTH EVERY 8 HOURS 02/12/21  Yes [provider]  clonazePAM (KLONOPIN) 0.5 MG tablet TAKE 1/2 TABLET BEFORE PANIC ATTACKS 02/12/21  Yes [provider]  gabapentin (NEURONTIN) 300 MG capsule TAKE 3 CAPSULES BY MOUTH AFTER MEALS AND 4 CAPSULES AT BEDTIME 01/02/21  Yes [provider]  levETIRAcetam (KEPPRA) 500 MG tablet Take 1 tablet (500 mg total) by mouth 2 (two) times daily.  01/01/17  Yes Horton, Mayer Masker, MD  Vilazodone HCl (VIIBRYD) 40 MG TABS TAKE 1 TABLET BY MOUTH WITH FOOD 01/29/21  Yes [provider]  ALPRAZolam (XANAX) 1 MG tablet Take 1 mg by mouth as needed for anxiety.    [provider]  amphetamine-dextroamphetamine (ADDERALL) 10 MG tablet Take 20 mg by mouth daily with breakfast.     [provider]  ibuprofen (ADVIL,MOTRIN) 400 MG tablet Take 1 tablet (400 mg total) by mouth every 6 (six) hours as needed. 07/27/17   Mesner, Barbara Cower, MD  methocarbamol (ROBAXIN-750) 750 MG tablet Take 1 tablet (750 mg total) by mouth every 8 (eight) hours as needed for muscle spasms. 03/07/17   Gilda Crease, MD  naproxen (NAPROSYN) 500 MG tablet Take 1 tablet (500 mg total) by mouth 2 (two) times daily. 03/07/17   Gilda Crease, MD  nitrofurantoin, macrocrystal-monohydrate, (MACROBID) 100 MG capsule Take 1 capsule  (100 mg total) by mouth 2 (two) times daily. 07/27/17   Mesner, Barbara Cower, MD  pyridoxine (B-6) 100 MG tablet Take by mouth.    [provider]    Allergies    Ciprofloxacin hcl  Review of Systems   Review of Systems  Constitutional:  Negative for chills and fever.  HENT:  Negative for ear pain and sore throat.   Eyes:  Negative for pain and visual disturbance.  Respiratory:  Positive for shortness of breath. Negative for cough.   Cardiovascular:  Positive for chest pain. Negative for palpitations.  Gastrointestinal:  Negative for abdominal pain and vomiting.  Genitourinary:  Negative for dysuria and hematuria.  Musculoskeletal:  Negative for arthralgias and back pain.  Skin:  Negative for color change and rash.  Neurological:  Negative for seizures and syncope.  All other systems reviewed and are negative.  Physical Exam Updated Vital Signs BP 111/73 (BP Location: Right Arm)   Pulse 100   Temp 98.1 F (36.7 C) (Oral)   Resp 18   Ht 5\' 5"  (1.651 m)   Wt 104.3 kg   LMP 05/04/2021   SpO2 100%   BMI 38.27 kg/m   Physical Exam Vitals and nursing note reviewed.  Constitutional:      General: She is not in acute distress.    Appearance: She is well-developed.  HENT:     Head: Normocephalic and atraumatic.  Eyes:     Conjunctiva/sclera: Conjunctivae normal.  Cardiovascular:     Rate and Rhythm: Normal rate and regular rhythm.     Heart sounds: No murmur heard. Pulmonary:     Effort: Pulmonary effort is normal. No respiratory distress.     Breath sounds: Normal breath sounds.  Chest:     Chest wall: Tenderness present.       Comments: Visible bruising to the sternum and surrounding chest wall with tenderness to palpation.  No noticeable step-offs or crepitus. Abdominal:     Palpations: Abdomen is soft.     Tenderness: There is no abdominal tenderness.  Musculoskeletal:     Cervical back: Neck supple.  Skin:    General: Skin is warm and dry.  Neurological:      General: No focal deficit present.     Mental Status: She is alert and oriented to person, place, and time.  Psychiatric:     Comments: Tearful affect      ED Results / Procedures / Treatments   Labs (all labs ordered are listed, but only abnormal results are displayed) Labs Reviewed  RESP PANEL  BY RT-PCR (FLU A&B, COVID) [id number]    EKG None  Radiology DG Chest 2 View  Result Date: 05/22/2021 CLINICAL DATA:  Chest pain. Heroin overdose 4 days ago. Soreness to the chest and pain on deep breathing. EXAM: CHEST - 2 VIEW COMPARISON:  03/17/2015 FINDINGS: Right perihilar and basilar infiltration likely representing pneumonia. Aspiration would also be a possibility. Left lung is clear. Heart size and pulmonary vascularity are normal. Mediastinal contours appear intact. Visualized ribs are nondepressed. IMPRESSION: Right perihilar and basilar infiltration, likely pneumonia or aspiration. Electronically Signed   By: Burman Nieves M.D.   On: 05/22/2021 18:40    Procedures Procedures   Medications Ordered in ED Medications  acetaminophen (TYLENOL) tablet 650 mg (650 mg Oral Given 05/22/21 1746)    ED Course  I have reviewed the triage vital signs and the nursing notes.  Pertinent labs & imaging results that were available during my care of the patient were reviewed by me and considered in my medical decision making (see chart for details).  Clinical Course as of 05/22/21 1911  Tue May 22, 2021  1908 DG Chest 2 View IMPRESSION: Right perihilar and basilar infiltration, likely pneumonia or aspiration.   [MB]    Clinical Course User Index [MB] Leone Brand   MDM Rules/Calculators/A&P                         30 year old female presents to the ER with complaints of chest wall pain and some shortness of breath with nasal congestion.  She is tearful on arrival.  Vitals with borderline tachycardia 102, slightly hypertensive, afebrile.  She does not appear to be in any  respiratory distress, speaking in full sentences without increased work of breathing.  Physical exam consistent with visible bruising to the sternum and surrounding chest wall with tenderness to palpation.  No fluctuance, crepitus.  COVID  and flu are negative.  EKG normal sinus rhythm.  Chest x-ray consistent with perihilar basal infiltration likely pneumonia or aspiration.  Favor aspiration pneumonia given LOC and CPR.  Will treat with Augmentin.  Lower suspicion for PE at this time, suspect her shortness of breath is secondary to the pneumonia.  No visible fractures to the ribs or sternum.  Pt reports unintentional overdose, denies any SI/ HI.   Patient was informed of the findings, encouraged to take antibiotics.  Encourage PCP follow-up.  We discussed return precautions.  She voiced understanding and is agreeable.  Stable for discharge.    Final Clinical Impression(s) / ED Diagnoses Final diagnoses:  Aspiration pneumonia of right lower lobe, unspecified aspiration pneumonia type [facility])    Rx / DC Orders ED Discharge Orders     None        Leone Brand 05/22/21 1913    Koleen Distance, MD 05/22/21 2315

## 2021-05-22 NOTE — ED Triage Notes (Signed)
She OD'd on Heroine at home 4 days ago. States her husband gave her narcan and CPR. She is here today with soreness to her chest and pain when she takes a deep breath. EKG at triage.

## 2021-05-22 NOTE — Discharge Instructions (Addendum)
You were evaluated in the Emergency Department and after careful evaluation, we did not find any emergent condition requiring admission or further testing in the hospital.  Your chest x-ray showed likely aspiration pneumonia.  Please take the antibiotic as directed until finished.  Please follow-up with a primary care doctor.  If you cannot afford one, please follow-up with [facility] which is a free clinic in the area, provided their contact information.  Please return to the Emergency Department if you experience any worsening of your condition.   Thank you for allowing [place] to be a part of your care.

## 2021-05-22 NOTE — ED Notes (Signed)
ED Provider at bedside. 

## 2021-06-20 ENCOUNTER — Ambulatory Visit (HOSPITAL_COMMUNITY)
Admission: EM | Admit: 2021-06-20 | Discharge: 2021-06-20 | Disposition: A | Discharge status: Home / Self Care | Payer: Medicaid Other | Attending: Psychiatry | Admitting: Psychiatry

## 2021-06-20 ENCOUNTER — Other Ambulatory Visit: Payer: Self-pay

## 2021-06-20 DIAGNOSIS — F331 Major depressive disorder, recurrent, moderate: Secondary | ICD-10-CM | POA: Insufficient documentation

## 2021-06-20 DIAGNOSIS — Z79899 Other long term (current) drug therapy: Secondary | ICD-10-CM | POA: Insufficient documentation

## 2021-06-20 DIAGNOSIS — F1590 Other stimulant use, unspecified, uncomplicated: Secondary | ICD-10-CM | POA: Insufficient documentation

## 2021-06-20 DIAGNOSIS — Z9151 Personal history of suicidal behavior: Secondary | ICD-10-CM | POA: Insufficient documentation

## 2021-06-20 DIAGNOSIS — G4700 Insomnia, unspecified: Secondary | ICD-10-CM | POA: Insufficient documentation

## 2021-06-20 NOTE — Discharge Instructions (Addendum)
Patient given information about PHP program at [facility].  Patient agreeable to plan to discharge.  Given opportunity to ask questions.  Appears to feel comfortable with discharge denies any current suicidal or homicidal thought. Patient is also instructed prior to discharge to: Take all medications as prescribed by her mental healthcare provider. Report any adverse effects and or reactions from the medicines to her outpatient provider promptly. Patient has been instructed & cautioned: To not engage in alcohol and or illegal drug use while on prescription medicines. In the event of worsening symptoms, patient is instructed to call the crisis hotline, 911 and or go to the nearest ED for appropriate evaluation and treatment of symptoms. To follow-up with her primary care provider for your other medical issues, concerns and or health care needs.

## 2021-06-20 NOTE — ED Provider Notes (Signed)
[facility] Medical Screening Exam  Patient Name: Jasmine Ellis MRN: [id number] Date of Evaluation: 06/20/21 Chief Complaint:   Diagnosis:  Final diagnoses:  MDD (major depressive disorder), recurrent episode, moderate (HCC)    History of Present illness: Jasmine Ellis is a 30 y.o. female.  with past medical history of depression, anxiety, seizure disorder, migraine presented walking to [facility] for worsening depression. She has been feeling very depressed, anxious, hopeless, have low energy for few months which has been worsening for last few weeks.  Patient states she has been having problems with her marriage and job.  She has been having frequent arguments all night with her husband.  Patient states she had suicidal thoughts last night but no plan or intent.  She states she will never attempt suicide because of her kids.  She reports insomnia, hopelessness, helplessness, low appetite, feeling guilty, low energy, poor memory, and anhedonia.  Patient reports history of high energy episodes when on drugs.  She denies SI, HI, auditory and visual hallucinations.  Patient states she has been using methamphetamine for last 4 days but she will not use that anymore.  Patient states she has been using OxyContin for last 3 to 4 months.  Patient had been to substance abuse treatment program last week for a week at [facility] therapy and had been on Suboxone. She is not on suboxone anymore  Patient states that she has a psychiatrist Dr. Loraine Leriche in [facility] who prescribes her medication and also has a counselor which she sees twice a week.  Patient has been on Viibryd and Rexulti for last couple of years.  She states it has been helping her but recently she has been going through a lot.  Patient reports 1 past suicidal attempt about 7 years ago when she tried to drown herself and overdosed on medications.  Patient is not interested in substance abuse programs. Pt contracts for safety  today. Psychiatric Specialty Exam  Presentation  General Appearance:Appropriate for Environment  Eye Contact:Minimal  Speech:Normal Rate  Speech Volume:Normal  Handedness:Right   Mood and Affect  Mood:Anxious; Depressed; Hopeless  Affect:Depressed; Tearful   Thought Process  Thought Processes:Coherent  Descriptions of Associations:Intact  Orientation:Full (Time, Place and Person)  Thought Content:WDL    Hallucinations:None  Ideas of Reference:None  Suicidal Thoughts:No  Homicidal Thoughts:No   Sensorium  Memory:Immediate Fair; Recent Fair; Remote Good  Judgment:Fair  Insight:Good   Executive Functions  Concentration:Fair  Attention Span:Fair  Recall:Fair  Fund of Knowledge:Fair  Language:Fair   Psychomotor Activity  Psychomotor Activity:Increased   Assets  Assets:Communication Skills; Desire for Improvement; Housing; Resilience   Sleep  Sleep:Fair  Number of hours:  No data recorded  No data recorded  Physical Exam: Physical Exam Vitals and nursing note reviewed.  Constitutional:      Appearance: Normal appearance. She is not ill-appearing, toxic-appearing or diaphoretic.  HENT:     Head: Normocephalic and atraumatic.  Pulmonary:     Effort: Pulmonary effort is normal.  Neurological:     General: No focal deficit present.     Mental Status: She is alert and oriented to person, place, and time.   Review of Systems  Constitutional:  Negative for chills and fever.  Eyes:  Negative for blurred vision.  Respiratory:  Negative for cough and shortness of breath.   Cardiovascular:  Negative for chest pain.  Gastrointestinal:  Negative for abdominal pain, nausea and vomiting.  Neurological:  Negative for dizziness and headaches.  Psychiatric/Behavioral:  Positive for depression, memory loss and substance abuse. Negative for hallucinations and suicidal ideas. The patient is nervous/anxious and has insomnia.   Blood pressure 112/80,  pulse 96, temperature 98.4 F (36.9 C), temperature source Oral, resp. rate 20, height 5\' 4"  (1.626 m), weight 239 lb (108.4 kg), SpO2 94 %. Body mass index is 41.02 kg/m.  Musculoskeletal: Strength & Muscle Tone: within normal limits Gait & Station: normal Patient leans: N/A   [facility] MSE Discharge Disposition for Follow up and Recommendations: Based on my evaluation the patient does not appear to have an emergency medical condition and can be discharged with resources and follow up care in outpatient services for Medication Management -Pt does not meet criteria for inpatient hospitalization.  Patient is not suicidal, homicidal, or psychotic.  -Patient is not interested in substance abuse programs.  Patient has a psychiatrist, and a therapist. -Patient is advised to follow-up with her psychiatrist for medication management and therapist and continue her psychiatric medication. -CSW talked with patient about PHP program at [facility].  Patient states she will call them directly. Pt was provided with information.   [place], MD 06/20/2021, 2:04 PM

## 2021-06-20 NOTE — ED Notes (Signed)
Pt discharged in no acute distress. Verbalized understanding of instructions for follow up care on AVS. Safety maintained.

## 2021-06-20 NOTE — BH Assessment (Addendum)
Triage Note: ROUTINE Pt presents to [facility] for a walk-in assessment. She reports her husband recommended she come for assessment. Pt states she has been feeling more depressed and anxious in the past couple of months. She denies current SI, thought about it yesterday without plan. Pt reports hx of "2-3" past suicide attempts. Pt denies HI and AVH. Pt states she has been participating in [facility] substance abuse tx program receiving Suboxone rx  from them. She is now seeing Dr Wandra Mannan for rx of klonopin, Rexulti, Keppra and one other medication. Pt reports she has been also getting 30 mg oxycontin TID for the past few months. She also used meth daily for the past few days. Pt denies alcohol, cocaine and thc use. Pt has 2 children in her home.

## 2021-12-12 ENCOUNTER — Encounter: Payer: Self-pay | Admitting: Neurology

## 2021-12-31 ENCOUNTER — Other Ambulatory Visit: Payer: Self-pay

## 2021-12-31 ENCOUNTER — Ambulatory Visit: Payer: Medicaid Other | Admitting: Neurology

## 2021-12-31 ENCOUNTER — Encounter: Payer: Self-pay | Admitting: Neurology

## 2021-12-31 VITALS — BP 129/92 | HR 118 | Ht 65.0 in | Wt 243.6 lb

## 2021-12-31 DIAGNOSIS — G40009 Localization-related (focal) (partial) idiopathic epilepsy and epileptic syndromes with seizures of localized onset, not intractable, without status epilepticus: Secondary | ICD-10-CM | POA: Diagnosis not present

## 2021-12-31 MED ORDER — LEVETIRACETAM 500 MG PO TABS: ORAL_TABLET | ORAL | 3 refills | Status: DC

## 2021-12-31 NOTE — Progress Notes (Signed)
NEUROLOGY CONSULTATION NOTE  Jasmine Ellis MRN: [id number] DOB: [birth year]  Referring provider: Raelyn Number, PA Primary care provider: Raelyn Number, PA  Reason for consult:  establish care for seizures   Thank you for your kind referral of Jasmine Ellis for consultation of the above symptoms. Although her history is well known to you, please allow me to reiterate it for the purpose of our medical record. She is alone in the office today. Records and images were personally reviewed where available.  HISTORY OF PRESENT ILLNESS: This is a 31 year old right-handed woman with a history of anxiety, depression, ADHD, presenting to establish care for seizures. Seizures started at age 22, she has no recollection of events, she was told her head and whole body suddenly turned to the left "like the exorcist," then she fell to the ground shaking, foaming at the mouth. She had bitten her tongue. She has had nocturnal seizures as well. She has body pains after a seizure, "like I was hit by a Mac truck," no focal weakness. She would have significant post-ictal confusion, one time she tried to jump out of a second story window because she was so frightened. She reports that almost every time after a seizure, she tastes pennies or has an odd taste in her mouth. Seizure trigger for her is a high stress situation. She states she knows what caused her seizures to happen, during the first seizure she was living with her mother-in-law, causing her such bad stress making her suicidal. The last convulsion was in 2021. She recalls being tried on different unrecalled medications in the past, Topiramate is mentioned on an ER note for seizures in 2018,at that time she was started on Levetiracetam 564m BID, since then she has been taking 50667m2 tab qhs with no side effects. She reports the [facility].   She recalls that when she was younger, her teachers would  catch her staring off, or she would catch herself doing it. She would call them her "staring phases." She has been told she does not respond. They still happen a few times a month. She denies any focal numbness/tingling/weakness, no myoclonic jerks. She has tremors ("shakes real bad"), her father and grandmother also have similar tremors and are "nervous nancies all the way around." She has significant anxiety and takes Xanax 67m64mID and Gabapentin to help her sleep at night. She is dealing with a lot of things right now and wakes up a lot at night. She lives with her father. She has 2 children. 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 She has a history of substance abuse and takes Suboxone, clean for several years. No pregnancy plans, she has had tubal ligation.   Epilepsy Risk Factors:  Her mother was in inpatient psychiatry around the time of her delivery. She had a normal birth and early development.  There is no history of febrile convulsions, CNS infections such as meningitis/encephalitis, significant traumatic brain injury, neurosurgical procedures, or family history of seizures.  Prior AEDs: Topiramate   PAST MEDICAL HISTORY: Past Medical History:  Diagnosis Date   Anxiety    Migraine    PPH (postpartum hemorrhage) 2016   Scoliosis    [facility]izures [place]  last episode 2014    PAST SURGICAL HISTORY: Past Surgical History:  Procedure Laterality Date   FINGER SURGERY  31 yo   LAPAROSCOPIC TUBAL LIGATION  09/25/2015   Procedure: LAPAROSCOPIC TUBAL LIGATION with filshie clips;  Surgeon: Emily Filbert, MD;  Location: [facility];  Service: Gynecology;;   WISDOM TOOTH EXTRACTION      MEDICATIONS: Current Outpatient Medications on File Prior to Visit  Medication Sig Dispense Refill   ALPRAZolam (XANAX) 1 MG tablet Take 1 mg by mouth as needed for anxiety.     amphetamine-dextroamphetamine (ADDERALL XR) 30 MG 24 hr capsule 2 (two) times daily.      brexpiprazole (REXULTI) 2 MG TABS tablet Take by mouth.     Buprenorphine HCl-Naloxone HCl 8-2 MG FILM DISSOLVE 1 FILM BY MOUTH EVERY 8 HOURS     gabapentin (NEURONTIN) 300 MG capsule 600 mg daily. 696m daily     levETIRAcetam (KEPPRA) 500 MG tablet Take 1 tablet (500 mg total) by mouth 2 (two) times daily. 60 tablet 0   Vilazodone HCl (VIIBRYD) 40 MG TABS TAKE 1 TABLET BY MOUTH WITH FOOD     No current facility-administered medications on file prior to visit.    ALLERGIES: Allergies  Allergen Reactions   Ciprofloxacin Hcl Hives    FAMILY HISTORY: Family History  Problem Relation Age of Onset   Hypertension Maternal Grandmother    Drug abuse Maternal Grandfather    Hypertension Paternal Grandmother    Drug abuse Paternal Grandfather     SOCIAL HISTORY: Social History   Socioeconomic History   Marital status: Single    Spouse name: Not on file   Number of children: Not on file   Years of education: Not on file   Highest education level: Not on file  Occupational History   Not on file  Tobacco Use   Smoking status: Every Day    Packs/day: 1.00    Years: 5.00    Pack years: 5.00    Types: Cigarettes   Smokeless tobacco: Never  Vaping Use   Vaping Use: Never used  Substance and Sexual Activity   Alcohol use: Yes   Drug use: Yes    Types: Marijuana    Comment: heroine   Sexual activity: Yes    Birth control/protection: None  Other Topics Concern   Not on file  Social History Narrative   Right handed    Social Determinants of Health   Financial Resource Strain: Not on file  Food Insecurity: Not on file  Transportation Needs: Not on file  Physical Activity: Not on file  Stress: Not on file  Social Connections: Not on file  Intimate Partner Violence: Not on file     PHYSICAL EXAM: Vitals:   12/31/21 1038  BP: (!) 129/92  Pulse: (!) 118  SpO2: 100%   General: No acute distress Head:  Normocephalic/atraumatic Skin/Extremities: No rash, no  edema Neurological Exam: Mental status: alert and oriented to person, place, and time, no dysarthria or aphasia, Fund of knowledge is appropriate.  Recent and remote memory are intact.  Attention and concentration are reduced, patient is verbose and needs redirection.    Cranial nerves: CN I: not tested CN II: pupils equal, round and reactive to light, visual fields intact CN III, IV, VI:  full range of motion, no nystagmus, no ptosis CN V: facial sensation intact CN VII: upper and lower face symmetric CN VIII: hearing intact to conversation Bulk & Tone: normal, no fasciculations. Motor: 5/5 throughout with no pronator drift. Sensation: intact to light touch, cold, pin, vibration  sense.  No extinction to double simultaneous stimulation.  Romberg test negative Deep Tendon Reflexes: +1 throughout Cerebellar: no incoordination on finger to nose testing Gait: narrow-based and steady, able to tandem walk adequately. 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 A 1-hour EEG will be ordered for seizure classification. She denies any seizures since 2021, refills sent for Levetiracetam 511m BID. No pregnancy plans.  [place] driving laws were discussed with the patient, and she knows to stop driving after a seizure, until 6 months seizure-free. We discussed avoidance of seizure triggers, including missing medication, sleep deprivation, alcohol. She identifies stress as a seizure trigger, continue follow-up with Behavioral Health. Follow-up in 1 year, call for any changes.    Thank you for allowing me to participate in the care of this patient. Please do not hesitate to call for any questions or concerns.   KEllouise Newer M.D.  CC: ARaelyn Number PA

## 2021-12-31 NOTE — Patient Instructions (Signed)
Good to meet you.  Schedule 1-hour EEG  2. Continue Keppra 500mg : Take 2 tablets every night  3. Follow-up in 1 year, call for any changes   Seizure Precautions: 1. If medication has been prescribed for you to prevent seizures, take it exactly as directed.  Do not stop taking the medicine without talking to your doctor first, even if you have not had a seizure in a long time.   2. Avoid activities in which a seizure would cause danger to yourself or to others.  Don't operate dangerous machinery, swim alone, or climb in high or dangerous places, such as on ladders, roofs, or girders.  Do not drive unless your doctor says you may.  3. If you have any warning that you may have a seizure, lay down in a safe place where you can't hurt yourself.    4.  No driving for 6 months from last seizure, as per [facility].   Please refer to the following link on the Epilepsy Foundation of America's website for more information: http://www.epilepsyfoundation.org/answerplace/Social/driving/drivingu.cfm   5.  Maintain good sleep hygiene. Avoid alcohol.  6.  Notify your neurology if you are planning pregnancy or if you become pregnant.  7.  Contact your doctor if you have any problems that may be related to the medicine you are taking.  8.  Call 911 and bring the patient back to the ED if:        A.  The seizure lasts longer than 5 minutes.       B.  The patient doesn't awaken shortly after the seizure  C.  The patient has new problems such as difficulty seeing, speaking or moving  D.  The patient was injured during the seizure  E.  The patient has a temperature over 102 F (39C)  F.  The patient vomited and now is having trouble breathing

## 2022-01-21 ENCOUNTER — Other Ambulatory Visit: Payer: Medicaid Other

## 2022-02-08 ENCOUNTER — Ambulatory Visit: Payer: Medicaid Other | Admitting: Neurology

## 2022-02-08 ENCOUNTER — Other Ambulatory Visit: Payer: Self-pay

## 2022-02-08 DIAGNOSIS — G40009 Localization-related (focal) (partial) idiopathic epilepsy and epileptic syndromes with seizures of localized onset, not intractable, without status epilepticus: Secondary | ICD-10-CM

## 2022-02-22 ENCOUNTER — Telehealth: Payer: Self-pay | Admitting: Neurology

## 2022-02-22 NOTE — Telephone Encounter (Signed)
Pt called in wanting to get results of her EEG ?

## 2022-02-22 NOTE — Telephone Encounter (Signed)
Pt called back no answer left a voice mail stated what we had talked about earlier that informed that Dr Karel Jarvis is out of the office she will return Monday and that I would let her know pt is asking for result ?

## 2022-02-22 NOTE — Telephone Encounter (Signed)
Patient is returning a call to someone. °

## 2022-02-22 NOTE — Telephone Encounter (Signed)
Pt called an informed that Dr Karel Jarvis is out of the office she will return Monday and that I would let her know pt is asking for result, pt verbalized understanding,  ?

## 2022-02-25 NOTE — Telephone Encounter (Signed)
Tried to call patient, left VM ?

## 2022-02-27 NOTE — Procedures (Signed)
ELECTROENCEPHALOGRAM REPORT ? ?Date of Study: 02/08/2022 ? ?Patient's Name: Jasmine Ellis ?MRN: [id number] ?Date of Birth: 08-09-1991 ? ?Referring Provider: Dr. Patrcia Dolly ? ?Clinical History: This is a 31 year old woman with recurrent convulsions with gustatory hallucinations. EEG for classification. ? ?Medications: ?NEURONTIN 300 MG capsule ?KEPPRA 500 MG tablet ?XANAX 1 MG tablet ?ADDERALL XR 30 MG 24 hr capsule ?REXULTI 2 MG TABS tablet ?Buprenorphine HCl-Naloxone HCl 8-2 MG FILM ?VIIBRYD 40 MG TABS ? ?Technical Summary: ?A multichannel digital 1-hour EEG recording measured by the international 10-20 system with electrodes applied with paste and impedances below 5000 ohms performed in our laboratory with EKG monitoring in an awake and asleep patient.  Hyperventilation was not performed. Photic stimulation was performed.  The digital EEG was referentially recorded, reformatted, and digitally filtered in a variety of bipolar and referential montages for optimal display.   ? ?Description: ?The patient is awake and asleep during the recording.  During maximal wakefulness, there is a symmetric, medium voltage 10 Hz posterior dominant rhythm that attenuates with eye opening.  The record is symmetric.  During drowsiness and sleep, there is an increase in theta slowing of the background.  Vertex waves and symmetric sleep spindles were seen.  Photic stimulation did not elicit any abnormalities. There were frequent runs lasting 3-5 seconds of rhythmic 5 Hz activity over the bilateral occipital regions, at times with embedded small spikes and sharply counted wave forms. There were no electrographic seizures seen. ? ?EKG lead was unremarkable. ? ? ?Impression: ?This 1-hour awake and asleep EEG is abnormal due to the presence of runs of rhythmic 5 Hz theta activity over the bilateral occipital regions with occasional embedded spikes seen.  ? ?Clinical Correlation of the above findings indicates focal cerebral dysfunction  over the bilateral occipital region suggestive of underlying structural or physiologic abnormality with a possible tendency for seizures to arise from these regions. If further clinical questions remain, prolonged EEG may be helpful.  Clinical correlation is advised. ? ? ?Patrcia Dolly, M.D. ? ?

## 2022-03-28 ENCOUNTER — Telehealth: Payer: Self-pay | Admitting: Neurology

## 2022-03-28 NOTE — Telephone Encounter (Signed)
Pt called in and left a message. She would like to get the results of her brain scan. ?

## 2022-03-28 NOTE — Telephone Encounter (Signed)
Discussed EEG results, all her questions were answered. ?

## 2022-12-18 ENCOUNTER — Ambulatory Visit: Payer: Medicaid Other | Admitting: Neurology

## 2022-12-18 ENCOUNTER — Encounter: Payer: Self-pay | Admitting: Neurology

## 2022-12-18 VITALS — BP 140/95 | HR 103 | Ht 65.0 in | Wt 238.6 lb

## 2022-12-18 DIAGNOSIS — G40009 Localization-related (focal) (partial) idiopathic epilepsy and epileptic syndromes with seizures of localized onset, not intractable, without status epilepticus: Secondary | ICD-10-CM

## 2022-12-18 MED ORDER — LEVETIRACETAM 500 MG PO TABS: ORAL_TABLET | ORAL | 3 refills | Status: DC

## 2022-12-18 NOTE — Patient Instructions (Signed)
Good to see you doing well.  Schedule MRI brain with and without contrast  2. Continue Levetiracetam (Keppra) 500mg : take 2 tablets every night  3. Follow-up in 1 year, call for any changes   Seizure Precautions: 1. If medication has been prescribed for you to prevent seizures, take it exactly as directed.  Do not stop taking the medicine without talking to your doctor first, even if you have not had a seizure in a long time.   2. Avoid activities in which a seizure would cause danger to yourself or to others.  Don't operate dangerous machinery, swim alone, or climb in high or dangerous places, such as on ladders, roofs, or girders.  Do not drive unless your doctor says you may.  3. If you have any warning that you may have a seizure, lay down in a safe place where you can't hurt yourself.    4.  No driving for 6 months from last seizure, as per [facility].   Please refer to the following link on the [place] website for more information: http://www.epilepsyfoundation.org/answerplace/Social/driving/drivingu.cfm   5.  Maintain good sleep hygiene. Avoid alcohol.  6.  Contact your doctor if you have any problems that may be related to the medicine you are taking.  7.  Call 911 and bring the patient back to the ED if:        A.  The seizure lasts longer than 5 minutes.       B.  The patient doesn't awaken shortly after the seizure  C.  The patient has new problems such as difficulty seeing, speaking or moving  D.  The patient was injured during the seizure  E.  The patient has a temperature over 102 F (39C)  F.  The patient vomited and now is having trouble breathing

## 2022-12-18 NOTE — Progress Notes (Signed)
NEUROLOGY FOLLOW UP OFFICE NOTE  DEKLYNN CHARLET [id number] 1991-03-16  HISTORY OF PRESENT ILLNESS: I had the pleasure of seeing Mckenzi Buonomo in follow-up in the neurology clinic on 12/18/2022.  The patient was last seen a year ago for seizures. She is alone in the office today. Records and images were personally reviewed where available.  Her EEG in 02/2022 was abnormal with runs of rhythmic 5 Hz theta activity over the bilateral occipital regions with occasional embedded spikes seen. Since her last visit, she continues to deny any seizures or seizure-like symptoms since 2021. She has been taking the Levetiracetam 3109m 2 tabs qhs (instead of BID) for several years with no issues. She recalls that she used to see flashing lights prior to seizures in the past. She denies any staring/unresponsive episodes, gaps in time, olfactory/gustatory/visual hallucinations, focal numbness/tingling/weakness, myoclonic jerks. She has a "nervous shake all the time." She denies any headaches, dizziness, vision changes, no recent falls. Sleep is good. She works at [employer] No pregnancy plans, s/p tubal ligation. She sees Psychiatry and takes Xanax and Gabapentin 3151mqhs for anxiety.   History on Initial Assessment 12/31/2021: This is a pleasant 32ear old right-handed woman with a history of anxiety, depression, ADHD, presenting to establish care for seizures. Seizures started at age 32she has no recollection of events, she was told her head and whole body suddenly turned to the left "like the exorcist," then she fell to the ground shaking, foaming at the mouth. She had bitten her tongue. She has had nocturnal seizures as well. She has body pains after a seizure, "like I was hit by a Mac truck," no focal weakness. She would have significant post-ictal confusion, one time she tried to jump out of a second story window because she was so frightened. She reports that almost every time after a seizure, she tastes pennies or  has an odd taste in her mouth. Seizure trigger for her is a high stress situation. She states she knows what caused her seizures to happen, during the first seizure she was living with her mother-in-law, causing her such bad stress making her suicidal. The last convulsion was in 2021. She recalls being tried on different unrecalled medications in the past, Topiramate is mentioned on an ER note for seizures in 2018,at that time she was started on Levetiracetam 50030mID, since then she has been taking 500m65mtab qhs with no side effects. She [facility].   She recalls that when she was younger, her teachers would catch her staring off, or she would catch herself doing it. She would call them her "staring phases." She has been told she does not respond. They still happen a few times a month. She denies any focal numbness/tingling/weakness, no myoclonic jerks. She has tremors ("shakes real bad"), her father and grandmother also have similar tremors and are "nervous nancies all the way around." She has significant anxiety and takes Xanax 1mg 16m and Gabapentin to help her sleep at night. She is dealing with a lot of things right now and wakes up a lot at night. She lives with her father. She has 2 children. She is starting a new job at the end of the month working on [employer]. She has a history of substance abuse and takes Suboxone, clean for several years. No pregnancy plans, she has had tubal ligation.   Epilepsy Risk Factors:  Her mother was in inpatient psychiatry around the time of her delivery. She had a normal birth and early development.  There is no history of febrile convulsions, CNS infections such as meningitis/encephalitis, significant traumatic brain injury, neurosurgical procedures, or family history of seizures.  Prior AEDs: Topiramate  Diagnostic Data: EEG 02/2022 was abnormal with runs of rhythmic 5 Hz theta activity  over the bilateral occipital regions with occasional embedded spikes seen.   PAST MEDICAL HISTORY: Past Medical History:  Diagnosis Date   Anxiety    Migraine    PPH (postpartum hemorrhage) 2016   Scoliosis    Seizures ([place])    last episode 2014    MEDICATIONS: Current Outpatient Medications on File Prior to Visit  Medication Sig Dispense Refill   ALPRAZolam (XANAX) 1 MG tablet Take 1 mg by mouth as needed for anxiety.     amphetamine-dextroamphetamine (ADDERALL XR) 30 MG 24 hr capsule 2 (two) times daily.     brexpiprazole (REXULTI) 2 MG TABS tablet Take by mouth.     Buprenorphine HCl-Naloxone HCl 8-2 MG FILM DISSOLVE 1 FILM BY MOUTH EVERY 8 HOURS     gabapentin (NEURONTIN) 300 MG capsule 600 mg daily. 669m daily     levETIRAcetam (KEPPRA) 500 MG tablet Take 2 tablets every night 180 tablet 3   Vilazodone HCl (VIIBRYD) 40 MG TABS TAKE 1 TABLET BY MOUTH WITH FOOD     No current facility-administered medications on file prior to visit.    ALLERGIES: Allergies  Allergen Reactions   Ciprofloxacin Hcl Hives    FAMILY HISTORY: Family History  Problem Relation Age of Onset   Hypertension Maternal Grandmother    Drug abuse Maternal Grandfather    Hypertension Paternal Grandmother    Drug abuse Paternal Grandfather     SOCIAL HISTORY: Social History   Socioeconomic History   Marital status: Single    Spouse name: Not on file   Number of children: Not on file   Years of education: Not on file   Highest education level: Not on file  Occupational History   Not on file  Tobacco Use   Smoking status: Former    Packs/day: 1.00    Years: 5.00    Total pack years: 5.00    Types: Cigarettes   Smokeless tobacco: Never  Vaping Use   Vaping Use: Every day  Substance and Sexual Activity   Alcohol use: Yes   Drug use: Not Currently    Types: Marijuana    Comment: heroine   Sexual activity: Yes    Birth control/protection: None  Other Topics Concern   Not on file   Social History Narrative   Right handed    Social Determinants of Health   Financial Resource Strain: Not on file  Food Insecurity: Not on file  Transportation Needs: Not on file  Physical Activity: Not on file  Stress: Not on file  Social Connections: Not on file  Intimate Partner Violence: Not on file     PHYSICAL EXAM: Vitals:   12/18/22 1013  BP: (!) 140/95  Pulse: (!) 103  SpO2: 97%   General: No acute distress Head:  Normocephalic/atraumatic Skin/Extremities: No rash, no edema Neurological Exam: alert and awake. No aphasia or dysarthria. Fund of knowledge is appropriate. Attention and concentration are normal.   Cranial nerves: Pupils equal, round. Extraocular movements intact with no nystagmus. Visual fields full.  No facial asymmetry.  Motor: Bulk and tone normal, muscle strength 5/5 throughout with no pronator drift.  Finger to nose testing intact.  Gait narrow-based and steady, able to tandem walk adequately.  Romberg negative. No resting tremor. There is a slight high frequency low amplitude postural > endpoint tremor L>R.   IMPRESSION: This is a pleasant 32 yo RH woman with a history of anxiety, depression, ADHD, with seizures suggestive of focal to bilateral tonic-clonic epilepsy. EEG showed runs of rhythmic theta activity over the bilateral occipital regions with embedded spikes. MRI brain with and without contrast will be ordered to assess for underlying structural abnormality. She has been seizure-free since 2021 on Levetiracetam 531m 2 tabs qhs, refills sent. We again discussed avoidance of seizure triggers. Follow-up in 1 year, call for any changes.    Thank you for allowing me to participate in her care.  Please do not hesitate to call for any questions or concerns.    KEllouise Newer M.D.   CC: ARaelyn Number PA

## 2023-01-17 ENCOUNTER — Other Ambulatory Visit: Payer: Self-pay | Admitting: Neurology

## 2023-01-18 ENCOUNTER — Other Ambulatory Visit: Payer: Medicaid Other

## 2023-01-30 ENCOUNTER — Telehealth: Payer: Self-pay | Admitting: Anesthesiology

## 2023-01-30 DIAGNOSIS — G40009 Localization-related (focal) (partial) idiopathic epilepsy and epileptic syndromes with seizures of localized onset, not intractable, without status epilepticus: Secondary | ICD-10-CM

## 2023-01-30 NOTE — Telephone Encounter (Signed)
Pt called stating she was unable to schedule MRI with [facility] because they dont take her insurance. States she was told to inform the doctor to see what can be done.

## 2023-01-30 NOTE — Telephone Encounter (Signed)
Order sent to [facility]

## 2023-02-05 ENCOUNTER — Encounter: Payer: Self-pay | Admitting: Neurology

## 2023-03-10 ENCOUNTER — Ambulatory Visit
Admission: RE | Admit: 2023-03-10 | Discharge: 2023-03-10 | Disposition: A | Discharge status: Home / Self Care | Payer: Medicaid Other | Source: Ambulatory Visit | Attending: Neurology | Admitting: Neurology

## 2023-03-10 DIAGNOSIS — G40009 Localization-related (focal) (partial) idiopathic epilepsy and epileptic syndromes with seizures of localized onset, not intractable, without status epilepticus: Secondary | ICD-10-CM

## 2023-03-10 MED ORDER — GADOPICLENOL 0.5 MMOL/ML IV SOLN
10.0000 mL | Freq: Once | INTRAVENOUS | Status: AC | PRN
  Administered 2023-03-10: 10 mL via INTRAVENOUS

## 2023-04-16 ENCOUNTER — Telehealth: Payer: Self-pay

## 2023-04-16 NOTE — Telephone Encounter (Signed)
Pt called an informed brain MRI looked fine, no tumor, stroke, or bleed.

## 2023-04-16 NOTE — Telephone Encounter (Signed)
-----   Message from Van Clines, MD sent at 04/15/2023 12:24 PM EDT ----- Pls let her know brain MRI looked fine, no tumor, stroke, or bleed. Thanks

## 2023-11-11 ENCOUNTER — Other Ambulatory Visit: Payer: Self-pay | Admitting: Physician Assistant

## 2023-11-11 DIAGNOSIS — R102 Pelvic and perineal pain: Secondary | ICD-10-CM

## 2023-11-20 ENCOUNTER — Ambulatory Visit
Admission: RE | Admit: 2023-11-20 | Discharge: 2023-11-20 | Disposition: A | Discharge status: Home / Self Care | Payer: Medicaid Other | Source: Ambulatory Visit | Attending: Physician Assistant | Admitting: Physician Assistant

## 2023-11-20 DIAGNOSIS — R102 Pelvic and perineal pain: Secondary | ICD-10-CM

## 2023-12-19 ENCOUNTER — Encounter: Payer: Self-pay | Admitting: Neurology

## 2023-12-19 ENCOUNTER — Ambulatory Visit: Payer: Medicaid Other | Admitting: Neurology

## 2023-12-19 VITALS — BP 115/87 | HR 94 | Ht 65.0 in | Wt 245.6 lb

## 2023-12-19 DIAGNOSIS — G40009 Localization-related (focal) (partial) idiopathic epilepsy and epileptic syndromes with seizures of localized onset, not intractable, without status epilepticus: Secondary | ICD-10-CM | POA: Diagnosis not present

## 2023-12-19 MED ORDER — LEVETIRACETAM 500 MG PO TABS: ORAL_TABLET | ORAL | 3 refills | Status: DC

## 2023-12-19 NOTE — Progress Notes (Signed)
 NEUROLOGY FOLLOW UP OFFICE NOTE  Jasmine Ellis [id number] 06-Apr-1991  HISTORY OF PRESENT ILLNESS: I had the pleasure of seeing Jasmine Ellis in follow-up in the neurology clinic on 12/19/2023.  The patient was last seen 32 year ago for seizures. She is alone in the office today. Records and images were personally reviewed where available.  Her EEG in 02/2022 was abnormal with runs of rhythmic 5 Hz theta activity over the bilateral occipital regions with occasional embedded spikes seen. I personally reviewed brain MRI with and without contrast done 03/2023 which did not show any acute changes, hippocampi symmetric. There was note of minimal generalized cerebral atrophy. She is on Levetiracetam  500mg  2 tabs at bedtime (instead of BID) with no convulsions since 2021. She denies any staring/unresponsive episodes. She reports over the past year, she has episodes that only occur when she is laying down at night, she would feel a nervous tic feeling, her eyes would be twitching and she feels jerky/just shaky for 15- 20 minutes. There is no loss of consciousness or confusion. She woke up the other night at 4am and felt that way. No tongue bite or incontinence. They may occur more during stressful days. They occur around 3 times a week. She gets good sleep 8-9 hours. She denies any solfactory/gustatory hallucinations, focal numbness/tingling/weakness, myoclonic jerks in the daytime. No headaches, dizziness, vision changes. She tripped on her cat a few months ago. Mood is good, she sees Psychiatry and takes Gabapentin 600mg  at bedtime. She notes the only new medication this year is Propranolol and Synthroid. No pregnancy plans (s/p tubal ligation).   History on Initial Assessment 12/31/2021: This is a pleasant 33 year old right-handed woman with a history of anxiety, depression, ADHD, presenting to establish care for seizures. Seizures started at age 33, she has no recollection of events, she was told her head  and whole body suddenly turned to the left like the exorcist, then she fell to the ground shaking, foaming at the mouth. She had bitten her tongue. She has had nocturnal seizures as well. She has body pains after a seizure, like I was hit by a [employer] truck, no focal weakness. She would have significant post-ictal confusion, one time she tried to jump out of a second story window because she was so frightened. She reports that almost every time after a seizure, she tastes pennies or has an odd taste in her mouth. Seizure trigger for her is a high stress situation. She states she knows what caused her seizures to happen, during the first seizure she was living with her mother-in-law, causing her such bad stress making her suicidal. The last convulsion was in 2021. She recalls being tried on different unrecalled medications in the past, Topiramate  is mentioned on an ER note for seizures in 2018,at that time she was started on Levetiracetam  500mg  BID, since then she has been taking 500mg  2 tab qhs with no side effects. She reports the last time she saw a neurologist in [place] was 6 years ago.   She recalls that when she was younger, her teachers would catch her staring off, or she would catch herself doing it. She would call them her staring phases. She has been told she does not respond. They still happen a few times a month. She denies any focal numbness/tingling/weakness, no myoclonic jerks. She has tremors (shakes real bad), her father and grandmother also have similar tremors and are nervous nancies all the way around. She has significant anxiety  and takes Xanax 1mg  BID and Gabapentin to help her sleep at night. She is dealing with a lot of things right now and wakes up a lot at night. She lives with her father. She has 2 children. 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 She has a history of substance abuse and takes Suboxone, clean for several years. No  pregnancy plans, she has had tubal ligation.   Epilepsy Risk Factors:  Her mother was in inpatient psychiatry around the time of her delivery. She had a normal birth and early development.  There is no history of febrile convulsions, CNS infections such as meningitis/encephalitis, significant traumatic brain injury, neurosurgical procedures, or family history of seizures.  Prior AEDs: Topiramate   Diagnostic Data: EEG 02/2022 was abnormal with runs of rhythmic 5 Hz theta activity over the bilateral occipital regions with occasional embedded spikes seen.   PAST MEDICAL HISTORY: Past Medical History:  Diagnosis Date   Anxiety    Migraine    PPH (postpartum hemorrhage) 2016   Scoliosis    Seizures (HCC)    last episode 2014    MEDICATIONS: Current Outpatient Medications on File Prior to Visit  Medication Sig Dispense Refill   ALPRAZolam (XANAX) 1 MG tablet Take 1 mg by mouth as needed for anxiety.     amphetamine -dextroamphetamine  (ADDERALL XR) 30 MG 24 hr capsule 2 (two) times daily.     brexpiprazole (REXULTI) 2 MG TABS tablet Take by mouth.     Buprenorphine HCl-Naloxone HCl 8-2 MG FILM DISSOLVE 1 FILM BY MOUTH EVERY 8 HOURS     gabapentin (NEURONTIN) 300 MG capsule 300 mg daily. 600mg  daily     levETIRAcetam  (KEPPRA ) 500 MG tablet Take 2 tablets every night 180 tablet 3   Vilazodone HCl (VIIBRYD) 40 MG TABS TAKE 1 TABLET BY MOUTH WITH FOOD     WEGOVY 0.25 MG/0.5ML SOAJ Inject 0.25 mg into the skin.     No current facility-administered medications on file prior to visit.    ALLERGIES: Allergies  Allergen Reactions   Ciprofloxacin Hcl Hives    FAMILY HISTORY: Family History  Problem Relation Age of Onset   Hypertension Maternal Grandmother    Drug abuse Maternal Grandfather    Hypertension Paternal Grandmother    Drug abuse Paternal Grandfather     SOCIAL HISTORY: Social History   Socioeconomic History   Marital status: Single    Spouse name: Not on file   Number  of children: Not on file   Years of education: Not on file   Highest education level: Not on file  Occupational History   Not on file  Tobacco Use   Smoking status: Former    Current packs/day: 1.00    Average packs/day: 1 pack/day for 5.0 years (5.0 ttl pk-yrs)    Types: Cigarettes   Smokeless tobacco: Never  Vaping Use   Vaping status: Every Day  Substance and Sexual Activity   Alcohol use: Yes   Drug use: Not Currently    Types: Marijuana    Comment: heroine   Sexual activity: Yes    Birth control/protection: None  Other Topics Concern   Not on file  Social History Narrative   Right handed    Social Drivers of Health   Financial Resource Strain: Not on file  Food Insecurity: Not on file  Transportation Needs: Not on file  Physical Activity: Not on file  Stress: Not on file  Social  Connections: Not on file  Intimate Partner Violence: Not on file     PHYSICAL EXAM: Vitals:   12/19/23 1000  BP: 115/87  Pulse: 94  SpO2: 96%   General: No acute distress Head:  Normocephalic/atraumatic Skin/Extremities: No rash, no edema Neurological Exam: alert and awake. No aphasia or dysarthria. Fund of knowledge is appropriate.  Attention and concentration are normal.   Cranial nerves: Pupils equal, round. Extraocular movements intact with no nystagmus. Visual fields full.  No facial asymmetry.  Motor: Bulk and tone normal, muscle strength 5/5 throughout with no pronator drift.   Finger to nose testing intact.  Gait narrow-based and steady, able to tandem walk adequately.  Romberg negative.   IMPRESSION: This is a pleasant 33 yo RH woman with a history of anxiety, depression, ADHD, with seizures suggestive of focal to bilateral tonic-clonic epilepsy. EEG showed runs of rhythmic theta activity over the bilateral occipital regions with embedded spikes. MRI brain with and without contrast unremarkable. She denies any convulsions since 2021. She has been having episodes at night where  she would feel jerky/shaky with eyes twitching, etiology unclear. We will do a 72-hour EEG for characterization. Continue Levetiracetam  500mg  2 tabs at bedtime. She is aware of [place] driving laws to stop driving after a seizure until 6 months seizure-free. Follow-up in 3 months, call for any changes.   Thank you for allowing me to participate in her care.  Please do not hesitate to call for any questions or concerns.   Darice Shivers, M.D.   CC: Rosina Amy, PA
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
## 2023-12-24 ENCOUNTER — Ambulatory Visit (INDEPENDENT_AMBULATORY_CARE_PROVIDER_SITE_OTHER): Payer: Medicaid Other | Admitting: Neurology

## 2023-12-24 DIAGNOSIS — G40009 Localization-related (focal) (partial) idiopathic epilepsy and epileptic syndromes with seizures of localized onset, not intractable, without status epilepticus: Secondary | ICD-10-CM

## 2023-12-24 NOTE — Procedures (Signed)
 ELECTROENCEPHALOGRAM REPORT  Date of Study: 12/24/2023  Patient's Name: Jasmine Ellis MRN: [id number] Date of Birth: Sep 17, 1991  Referring Provider: Dr. Rayfield Cairo  Clinical History: This is a 33 year old woman with a history of seizures, with episodes at night where she would feel "jerky/shaky" with eyes twitching, etiology unclear. EEG for classification.  Seizure Medications: Keppra   Technical Summary: A multichannel digital EEG recording measured by the international 10-20 system with electrodes applied with paste and impedances below 5000 ohms performed in our laboratory with EKG monitoring in an awake and drowsy patient.  Hyperventilation was not performed. Photic stimulation was performed.  The digital EEG was referentially recorded, reformatted, and digitally filtered in a variety of bipolar and referential montages for optimal display.    Description: The patient is awake and drowsy during the recording.  During maximal wakefulness, there is a symmetric, medium voltage 8 Hz posterior dominant rhythm that attenuates with eye opening.  The record is symmetric.  During drowsiness, there is an increase in theta slowing of the background. Sleep was not captured. Photic stimulation did not elicit any abnormalities.  There were no epileptiform discharges or electrographic seizures seen.    EKG lead was unremarkable.  Impression: This awake and drowsy EEG is normal.    Clinical Correlation: A normal EEG does not exclude a clinical diagnosis of epilepsy.  If further clinical questions remain, prolonged EEG may be helpful.  Clinical correlation is advised.   Rayfield Cairo, M.D.

## 2023-12-24 NOTE — Progress Notes (Signed)
 EEG complete - results pending

## 2023-12-25 ENCOUNTER — Telehealth: Payer: Self-pay

## 2023-12-25 DIAGNOSIS — G40009 Localization-related (focal) (partial) idiopathic epilepsy and epileptic syndromes with seizures of localized onset, not intractable, without status epilepticus: Secondary | ICD-10-CM

## 2023-12-25 NOTE — Telephone Encounter (Signed)
-----   Message from Van Clines sent at 12/25/2023 11:49 AM EST ----- Pls let her know the EEG was normal. Proceed with 3-day EEG as discussed, pls order 72-hour, thanks

## 2023-12-25 NOTE — Telephone Encounter (Signed)
Pt called informed EEG was normal. Proceed with 3-day EEG as discussed

## 2024-01-30 ENCOUNTER — Other Ambulatory Visit: Payer: Medicaid Other

## 2024-02-06 ENCOUNTER — Ambulatory Visit: Payer: Medicaid Other | Admitting: Neurology

## 2024-02-06 DIAGNOSIS — G40009 Localization-related (focal) (partial) idiopathic epilepsy and epileptic syndromes with seizures of localized onset, not intractable, without status epilepticus: Secondary | ICD-10-CM | POA: Diagnosis not present

## 2024-02-06 NOTE — Progress Notes (Signed)
 Ambulatory EEG hooked up and running. Light flashing. Push button tested. Camera and event log explained. Batteries explained. Patient understood.

## 2024-02-09 NOTE — Progress Notes (Signed)
 AMB EEG discontinued.  Skin Breakdown:No Diary Returned: Yes

## 2024-03-02 NOTE — Procedures (Signed)
 ELECTROENCEPHALOGRAM REPORT  Dates of Recording: 02/06/2024 10:04AM to 02/09/2024 4:52AM  Patient's Name: Jasmine Ellis MRN: [id number] Date of Birth: Aug 23, 1991  Referring Provider: Dr. Patrcia Dolly  Procedure: 61:52-hour ambulatory video EEG  History:This is a 33 year old woman with a history of seizures, with episodes at night where she would feel "jerky/shaky" with eyes twitching, etiology unclear. EEG for classification.   Seizure Medications: Keppra  Technical Summary: This is a 61:52-hour multichannel digital video EEG recording measured by the international 10-20 system with electrodes applied with paste and impedances below 5000 ohms performed as portable with EKG monitoring.  The digital EEG was referentially recorded, reformatted, and digitally filtered in a variety of bipolar and referential montages for optimal display.    DESCRIPTION OF RECORDING: During maximal wakefulness, the background activity consisted of a symmetric 9 Hz posterior dominant rhythm which was reactive to eye opening.  There were no epileptiform discharges or focal slowing seen in wakefulness.  During the recording, the patient progresses through wakefulness, drowsiness, and Stage 2 sleep.  Again, there were no epileptiform discharges seen.  Events: On 2/28 at 1047, 1336, 1402, 1917, 2003 hours, there were push button events, no video recorded, patient did not report symptoms on diary. Electrographically, there were no EEG or EKG changes seen.  On 2/28 at 1701 hours, she felt uncomfortable and twitchy, irritable and trying to take a nap, feels weird. No video recorded. Electrographically, there were no EEG or EKG changes seen.  On 3/1 at 1622 and 3/3 at 0212 hours, push button events reviewed on video were accidental.    There were no electrographic seizures seen.  EKG lead was unremarkable.  IMPRESSION: This 61:52-hour ambulatory video EEG study is normal.    CLINICAL CORRELATION: A normal EEG does  not exclude a clinical diagnosis of epilepsy. Episode of feeling uncomfortable, weird, twitchy, did not show EEG correlate.  If further clinical questions remain, inpatient video EEG monitoring may be helpful.   Patrcia Dolly, M.D.

## 2024-03-02 NOTE — Addendum Note (Signed)
 Addended by: Van Clines on: 03/02/2024 05:28 PM   Modules accepted: Orders

## 2024-03-18 ENCOUNTER — Ambulatory Visit: Payer: Medicaid Other | Admitting: Neurology

## 2024-04-29 ENCOUNTER — Emergency Department (HOSPITAL_COMMUNITY)

## 2024-04-29 ENCOUNTER — Emergency Department (HOSPITAL_COMMUNITY): Admission: EM | Admit: 2024-04-29 | Discharge: 2024-04-29 | Disposition: A | Discharge status: Home / Self Care

## 2024-04-29 ENCOUNTER — Other Ambulatory Visit (HOSPITAL_COMMUNITY): Payer: Self-pay

## 2024-04-29 ENCOUNTER — Other Ambulatory Visit: Payer: Self-pay

## 2024-04-29 ENCOUNTER — Encounter (HOSPITAL_COMMUNITY): Payer: Self-pay

## 2024-04-29 ENCOUNTER — Telehealth (HOSPITAL_COMMUNITY): Payer: Self-pay

## 2024-04-29 ENCOUNTER — Other Ambulatory Visit (HOSPITAL_BASED_OUTPATIENT_CLINIC_OR_DEPARTMENT_OTHER): Payer: Self-pay

## 2024-04-29 DIAGNOSIS — S161XXA Strain of muscle, fascia and tendon at neck level, initial encounter: Secondary | ICD-10-CM | POA: Insufficient documentation

## 2024-04-29 DIAGNOSIS — S20219A Contusion of unspecified front wall of thorax, initial encounter: Secondary | ICD-10-CM | POA: Insufficient documentation

## 2024-04-29 DIAGNOSIS — R079 Chest pain, unspecified: Secondary | ICD-10-CM | POA: Diagnosis present

## 2024-04-29 DIAGNOSIS — Y92410 Unspecified street and highway as the place of occurrence of the external cause: Secondary | ICD-10-CM | POA: Diagnosis not present

## 2024-04-29 DIAGNOSIS — R519 Headache, unspecified: Secondary | ICD-10-CM | POA: Diagnosis not present

## 2024-04-29 DIAGNOSIS — S301XXA Contusion of abdominal wall, initial encounter: Secondary | ICD-10-CM | POA: Diagnosis not present

## 2024-04-29 LAB — BASIC METABOLIC PANEL WITH GFR
Anion gap: 8 (ref 5–15)
BUN: 6 mg/dL (ref 6–20)
CO2: 27 mmol/L (ref 22–32)
Calcium: 8.6 mg/dL — ABNORMAL LOW (ref 8.9–10.3)
Chloride: 101 mmol/L (ref 98–111)
Creatinine, Ser: 0.8 mg/dL (ref 0.44–1.00)
GFR, Estimated: >60 mL/min (ref >60)
Glucose, Bld: 105 mg/dL — ABNORMAL HIGH (ref 70–99)
Potassium: 3.5 mmol/L (ref 3.5–5.1)
Sodium: 136 mmol/L (ref 135–145)

## 2024-04-29 LAB — CBC
HCT: 37.2 % (ref 36.0–46.0)
Hemoglobin: 12.1 g/dL (ref 12.0–15.0)
MCH: 28.7 pg (ref 26.0–34.0)
MCHC: 32.5 g/dL (ref 30.0–36.0)
MCV: 88.2 fL (ref 80.0–100.0)
Platelets: 236 10*3/uL (ref 150–400)
RBC: 4.22 MIL/uL (ref 3.87–5.11)
RDW: 12.9 % (ref 11.5–15.5)
WBC: 7.4 10*3/uL (ref 4.0–10.5)
nRBC: 0 % (ref 0.0–0.2)

## 2024-04-29 LAB — HCG, SERUM, QUALITATIVE: Preg, Serum: NEGATIVE

## 2024-04-29 MED ORDER — FENTANYL CITRATE PF 50 MCG/ML IJ SOSY
50.0000 ug | PREFILLED_SYRINGE | Freq: Once | INTRAMUSCULAR | Status: AC
  Administered 2024-04-29: 50 ug via INTRAVENOUS
  Filled 2024-04-29: qty 1

## 2024-04-29 MED ORDER — ONDANSETRON HCL 4 MG/2ML IJ SOLN
4.0000 mg | Freq: Once | INTRAMUSCULAR | Status: AC
  Administered 2024-04-29: 4 mg via INTRAVENOUS
  Filled 2024-04-29: qty 2

## 2024-04-29 MED ORDER — METHOCARBAMOL 500 MG PO TABS
500.0000 mg | ORAL_TABLET | Freq: Two times a day (BID) | ORAL | 0 refills | Status: AC
  Filled 2024-04-29 (×2): qty 20, 10d supply, fill #0

## 2024-04-29 MED ORDER — IOHEXOL 300 MG/ML  SOLN
100.0000 mL | Freq: Once | INTRAMUSCULAR | Status: AC | PRN
  Administered 2024-04-29: 100 mL via INTRAVENOUS

## 2024-04-29 MED ORDER — KETOROLAC TROMETHAMINE 15 MG/ML IJ SOLN
15.0000 mg | Freq: Once | INTRAMUSCULAR | Status: AC
  Administered 2024-04-29: 15 mg via INTRAVENOUS
  Filled 2024-04-29: qty 1

## 2024-04-29 MED ORDER — NAPROXEN 500 MG PO TABS
500.0000 mg | ORAL_TABLET | Freq: Two times a day (BID) | ORAL | 0 refills | Status: AC
  Filled 2024-04-29 (×2): qty 30, 15d supply, fill #0

## 2024-04-29 MED ORDER — METHOCARBAMOL 500 MG PO TABS
500.0000 mg | ORAL_TABLET | Freq: Two times a day (BID) | ORAL | 0 refills | Status: AC
  Filled 2024-04-29: qty 20, 10d supply, fill #0
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
## 2024-04-29 NOTE — ED Notes (Signed)
 ED Provider at bedside.

## 2024-04-29 NOTE — ED Notes (Signed)
 Patient transported to CT

## 2024-04-29 NOTE — Discharge Instructions (Addendum)
 Your CT scans did not show any acute findings.  Please take the anti-inflammatories for the next week as needed for pain.  Take the Robaxin  as needed for muscle spasms as you will likely be sore over the next few days.  Please follow-up with your doctor.  Return to the ER for worsening symptoms.

## 2024-04-29 NOTE — ED Notes (Signed)
 X ray in room.
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
## 2024-04-30 ENCOUNTER — Other Ambulatory Visit (HOSPITAL_COMMUNITY): Payer: Self-pay

## 2024-04-30 ENCOUNTER — Other Ambulatory Visit (HOSPITAL_BASED_OUTPATIENT_CLINIC_OR_DEPARTMENT_OTHER): Payer: Self-pay

## 2024-09-11 ENCOUNTER — Other Ambulatory Visit: Payer: Self-pay | Admitting: Neurology

## 2024-10-18 ENCOUNTER — Ambulatory Visit: Admitting: Neurology

## 2024-12-27 NOTE — Telephone Encounter (Signed)
 Copied from CRM [id number]. Topic: Appointments - Scheduling Inquiry for Clinic >> Dec 27, 2024  2:36 PM Dedra B wrote: Reason for CRM: Patient said her PCP referred her for sleep study and she's calling to schedule it. Please call patient. This encounter was created in error - please disregard.

## 2024-12-28 ENCOUNTER — Telehealth: Payer: Self-pay

## 2024-12-28 NOTE — Telephone Encounter (Signed)
 Copied from CRM [id number]. Topic: Appointments - Scheduling Inquiry for Clinic >> Dec 27, 2024  2:36 PM Dedra B wrote: Reason for CRM: Patient said her PCP referred her for sleep study and she's calling to schedule it. Please call patient.    Called and spoke to pt. Advised that her referral was for an initial sleep consult, and the sleep consult would have to be completed prior to a sleep study being ordered. Pt verbalized understanding and is called to get new pt appt scheduled, NFN.

## 2025-01-05 ENCOUNTER — Encounter: Payer: Self-pay | Admitting: Adult Health

## 2025-01-05 ENCOUNTER — Ambulatory Visit: Admitting: Adult Health

## 2025-01-05 DIAGNOSIS — G40909 Epilepsy, unspecified, not intractable, without status epilepticus: Secondary | ICD-10-CM | POA: Diagnosis not present

## 2025-01-05 DIAGNOSIS — R0683 Snoring: Secondary | ICD-10-CM

## 2025-01-05 DIAGNOSIS — Z6841 Body Mass Index (BMI) 40.0 and over, adult: Secondary | ICD-10-CM | POA: Diagnosis not present

## 2025-01-05 DIAGNOSIS — E669 Obesity, unspecified: Secondary | ICD-10-CM

## 2025-01-05 DIAGNOSIS — Z87891 Personal history of nicotine dependence: Secondary | ICD-10-CM | POA: Diagnosis not present

## 2025-01-05 DIAGNOSIS — R5383 Other fatigue: Secondary | ICD-10-CM | POA: Diagnosis not present

## 2025-01-05 NOTE — Progress Notes (Signed)
 "  @Patient  ID: Jasmine Ellis, female    DOB: [birth year], 34 y.o.   MRN: [id number]  Chief Complaint  Patient presents with   Consult    Sleep    Referring provider: Rosalea Rosina SAILOR, PA  HPI: 34 year old female seen for sleep consult January 05, 2025 for evaluation of possible sleep apnea.    TEST/EVENTS : Reviewed 01/05/2025  Discussed the use of AI scribe software for clinical note transcription with the patient, who gave verbal consent to proceed.  History of Present Illness Jasmine Ellis is a 34 year old female who presents for evaluation of potential sleep apnea.  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 She experiences snoring, though not loudly, ' Her sleep schedule involves going to bed between 9 and 11 PM, waking up a few times during the night, and rising at 3:30 to 4:00 AM for work. On weekends, she wakes around 10:30 AM. 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  No symptoms of cataplexy or sleep paralysis.  She has a history of seizures that began in her early twenties and has been on medication since then. She has not had a seizure in over twelve years and takes Keppra  every night for seizure control. The cause of her seizures is unknown.  She has hypothyroidism and takes thyroid medication. She also experiences fatigue, which she attributes to a combination of hypothyroidism, depression, anxiety, and medication side effects. She takes vilazodone for depression and alprazolam for anxiety.  Her BMI is currently 42,  and she weighs 253 pounds, having lost weight recently. She is actively working on weight loss and has a prescription for Zepbound injections, which she has not yet started.  She has a family history of sleep apnea and COPD, as her father has both conditions. She lives with her father and daughter.  She has a history of high  blood pressure and takes propranolol for it. She also takes rosuvastatin for cholesterol management.  She quit smoking and drug use twelve years ago and drinks alcohol infrequently, about three times a year.  Has ADD on Adderall.    Allergies[1]  Immunization History  Administered Date(s) Administered   Tdap 04/14/2015    Past Medical History:  Diagnosis Date   Anxiety    Migraine    PPH (postpartum hemorrhage) 2016   Scoliosis    Seizures (HCC)    last episode 2014    Tobacco History: Tobacco Use History[2] Counseling given: Not Answered Tobacco comments: Quit in 2022. Smoked for about 15 years, 1/2ppd   Outpatient Medications Prior to Visit  Medication Sig Dispense Refill   ALPRAZolam (XANAX) 1 MG tablet Take 1 mg by mouth as needed for anxiety.     amphetamine -dextroamphetamine  (ADDERALL XR) 30 MG 24 hr capsule 2 (two) times daily.     Buprenorphine HCl-Naloxone HCl 8-2 MG FILM DISSOLVE 1 FILM BY MOUTH EVERY 8 HOURS     gabapentin (NEURONTIN) 300 MG capsule 300 mg daily. 600mg  daily     levETIRAcetam  (KEPPRA ) 500 MG tablet Take 2 tablets every night 180 tablet 3   levothyroxine (SYNTHROID) 88 MCG tablet Take 88 mcg by mouth daily before breakfast.     propranolol (INDERAL) 10 MG tablet Take 10 mg by mouth 3 (three) times daily as needed.     Vilazodone HCl (VIIBRYD) 40 MG TABS TAKE 1  TABLET BY MOUTH WITH FOOD     brexpiprazole (REXULTI) 2 MG TABS tablet Take by mouth. (Patient not taking: Reported on 01/05/2025)     busPIRone (BUSPAR) 15 MG tablet Take 15 mg by mouth 2 (two) times daily. (Patient not taking: Reported on 01/05/2025)     methocarbamol  (ROBAXIN ) 500 MG tablet Take 1 tablet (500 mg total) by mouth 2 (two) times daily. (Patient not taking: Reported on 01/05/2025) 20 tablet 0   methocarbamol  (ROBAXIN ) 500 MG tablet Take 1 tablet (500 mg total) by mouth 2 (two) times daily. (Patient not taking: Reported on 01/05/2025) 20 tablet 0   naproxen  (NAPROSYN ) 500 MG  tablet Take 1 tablet (500 mg total) by mouth 2 (two) times daily. (Patient not taking: Reported on 01/05/2025) 30 tablet 0   naproxen  (NAPROSYN ) 500 MG tablet Take 1 tablet (500 mg total) by mouth 2 (two) times daily. (Patient not taking: Reported on 01/05/2025) 30 tablet 0   nystatin ointment (MYCOSTATIN) Apply topically 2 (two) times daily. (Patient not taking: Reported on 01/05/2025)     rosuvastatin (CRESTOR) 10 MG tablet Take 10 mg by mouth daily. (Patient not taking: Reported on 01/05/2025)     tirzepatide (ZEPBOUND) 5 MG/0.5ML Pen Inject 5 mg into the skin once a week. (Patient not taking: Reported on 01/05/2025)     WEGOVY 0.25 MG/0.5ML SOAJ Inject 0.25 mg into the skin. (Patient not taking: Reported on 01/05/2025)     No facility-administered medications prior to visit.     Review of Systems:   Constitutional:   No  weight loss, night sweats,  Fevers, chills, +fatigue, or  lassitude.  HEENT:   No headaches,  Difficulty swallowing,  Tooth/dental problems, or  Sore throat,                No sneezing, itching, ear ache, nasal congestion, post nasal drip,   CV:  No chest pain,  Orthopnea, PND, swelling in lower extremities, anasarca, dizziness, palpitations, syncope.   GI  No heartburn, indigestion, abdominal pain, nausea, vomiting, diarrhea, change in bowel habits, loss of appetite, bloody stools.   Resp: No shortness of breath with exertion or at rest.  No excess mucus, no productive cough,  No non-productive cough,  No coughing up of blood.  No change in color of mucus.  No wheezing.  No chest wall deformity  Skin: no rash or lesions.  GU: no dysuria, change in color of urine, no urgency or frequency.  No flank pain, no hematuria   MS:  No joint pain or swelling.  No decreased range of motion.  No back pain.    Physical Exam  BP 117/88   Pulse (!) 105 Comment: Rechecked due to elevated pulse rate  Temp 97.8 F (36.6 C)   Ht 5' 5 (1.651 m) Comment: Per pt  Wt 253 lb 9.6 oz  (115 kg)   SpO2 98% Comment: RA  BMI 42.20 kg/m   GEN: A/Ox3; pleasant , NAD, well nourished    HEENT:  [place]/AT,  , NOSE-clear, THROAT-clear, no lesions, no postnasal drip or exudate noted.  Class II-III MP airway  NECK:  Supple w/ fair ROM; no JVD; normal carotid impulses w/o bruits; no thyromegaly or nodules palpated; no lymphadenopathy.    RESP  Clear  P & A; w/o, wheezes/ rales/ or rhonchi. no accessory muscle use, no dullness to percussion  CARD:  RRR, no m/r/g, no peripheral edema, pulses intact, no cyanosis or clubbing.  GI:   Soft &  nt; nml bowel sounds; no organomegaly or masses detected.   Musco: Warm bil, no deformities or joint swelling noted.   Neuro: alert, no focal deficits noted.    Skin: Warm, no lesions or rashes    Lab Results:Reviewed 01/05/2025   CBC   BMET   BNP No results found for: BNP  ProBNP No results found for: PROBNP  Imaging: No results found.  Administration History     None           No data to display          No results found for: NITRICOXIDE     01/05/2025    8:00 AM  Results of the [facility] flowsheet  Sitting and reading 1  Watching TV 0  Sitting, inactive in a public place (e.g. a theatre or a meeting) 0  As a passenger in a car for an hour without a break 1  Lying down to rest in the afternoon when circumstances permit 2  Sitting and talking to someone 0  Sitting quietly after a lunch without alcohol 0  In a car, while stopped for a few minutes in traffic 0  Total score 4        Assessment & Plan:   Assessment and Plan Assessment & Plan Evaluation for obstructive sleep apnea  -has suspicious symptoms with elevated BMI, snoring and daytime fatigue. Obstructive sleep apnea is suspected due to obesity (BMI 42), family history, and snoring. No prior sleep study has been conducted. Symptoms include fatigue and daytime sleepiness, possibly worsened by hypothyroidism, depression, anxiety, and medication  side effects. She has not had seizures in over 12 years. We discussed more comprehensive in lab sleep study with her history of seizure disorder. She has minimal sleep symptoms and no seizure activity for >12 yrs. Will start with home study if indicated can set up NPSG in future if indicated.   Discussed the impact of sleep apnea on cardiovascular health, mood, and well-being, including risks of congestive heart failure, arrhythmias, stroke, and hypertension. Emphasized weight loss as a key treatment. Ordered a home sleep study through [facility] and scheduled a follow-up in six weeks to review results. Provided information on sleep apnea and discussed the potential need for an in-lab study if complex sleep apnea is indicated.  - discussed how weight can impact sleep and risk for sleep disordered breathing - discussed options to assist with weight loss: combination of diet modification, cardiovascular and strength training exercises   - had an extensive discussion regarding the adverse health consequences related to untreated sleep disordered breathing - specifically discussed the risks for hypertension, coronary artery disease, cardiac dysrhythmias, cerebrovascular disease, and diabetes - lifestyle modification discussed   - discussed how sleep disruption can increase risk of accidents, particularly when driving - safe driving practices were discussed     Obesity   Her BMI is 42, contributing to obstructive sleep apnea risk. Weight has decreased from 258 lbs to 253 lbs. She is actively working on weight loss through dietary changes and considering Zepbound injections. Weight loss is crucial for treating sleep apnea and improving overall health and life insurance eligibility. Encouraged continued weight loss efforts and discussed the potential use of Zepbound injections for weight management.   Seizure disorder- controlled on medication.  No seizure activity for >12 yrs . Continue follow up  with Neurology.      Jasmine Teegarden, NP 01/05/2025  I spent 36  minutes dedicated to the care of this patient on  the date of this encounter to include pre-visit review of records, face-to-face time with the patient discussing conditions above, post visit ordering of testing, clinical documentation with the electronic health record, making appropriate referrals as documented, and communicating necessary findings to members of the patients care team.      [1]  Allergies Allergen Reactions   Ciprofloxacin Hcl Hives   Hydroxyzine Pamoate   [2]  Social History Tobacco Use  Smoking Status Former   Current packs/day: 1.00   Average packs/day: 1 pack/day for 5.0 years (5.0 ttl pk-yrs)   Types: Cigarettes  Smokeless Tobacco Never  Tobacco Comments   Quit in 2022. Smoked for about 15 years, 1/2ppd   "

## 2025-01-05 NOTE — Patient Instructions (Signed)
 Set up for home sleep study.  Work on healthy weight loss.  Do not drive if sleepy  Use caution with sedating medications  Follow up in 6 weeks and As needed  -Virtual visit.

## 2025-01-07 ENCOUNTER — Telehealth: Payer: Self-pay | Admitting: Adult Health

## 2025-01-07 NOTE — Telephone Encounter (Signed)
 Medicaid does not want to approve a HST as they have you're recent office visit encounter and it has been reviewed and per their Medicaid  Please be advised that your request, made on 01/05/2025, for patient Jasmine Ellis, for  CPT 95800 - HST, or Home Sleep Test device - a home based healthcare device used to  diagnose sleep disordered breathing, is pending. For a home sleep study with [place] [employer]  health plan please indicate if the member has additional sympotms to meet the criteria for a home  sleep study or indicate if this request can be changed to an in lab study, cpt 95810 diagnostic  only or 04188 split night diagnostic with titration. If we do not receive the required information by 01/11/25 8AM EST, this request will be  forwarded to clinical review and a determination will be made within one (1) business day based upon the clinical information you have already provided. This may result in an unfavorable  decision

## 2025-01-24 ENCOUNTER — Encounter

## 2025-03-03 ENCOUNTER — Ambulatory Visit: Admitting: Adult Health
